# Patient Record
Sex: Female | Born: 1965 | Race: White | Hispanic: Yes | Marital: Married | Smoking: Former smoker
Health system: Southern US, Community
[De-identification: ages and names within clinical notes are randomized; demographics above are authoritative.]

## PROBLEM LIST (undated history)

## (undated) DIAGNOSIS — E739 Lactose intolerance, unspecified: Secondary | ICD-10-CM

## (undated) DIAGNOSIS — K9041 Non-celiac gluten sensitivity: Secondary | ICD-10-CM

## (undated) DIAGNOSIS — Z91018 Allergy to other foods: Secondary | ICD-10-CM

## (undated) DIAGNOSIS — G43909 Migraine, unspecified, not intractable, without status migrainosus: Secondary | ICD-10-CM

## (undated) DIAGNOSIS — T7840XA Allergy, unspecified, initial encounter: Secondary | ICD-10-CM

## (undated) DIAGNOSIS — R42 Dizziness and giddiness: Secondary | ICD-10-CM

## (undated) HISTORY — DX: Non-celiac gluten sensitivity: K90.41

## (undated) HISTORY — DX: Lactose intolerance, unspecified: E73.9

## (undated) HISTORY — PX: BREAST REDUCTION SURGERY: SHX8

## (undated) HISTORY — DX: Allergy to other foods: Z91.018

## (undated) HISTORY — DX: Allergy, unspecified, initial encounter: T78.40XA

## (undated) HISTORY — PX: MOUTH SURGERY: SHX715

## (undated) HISTORY — PX: TONSILLECTOMY: SHX5217

## (undated) HISTORY — PX: OTHER SURGICAL HISTORY: SHX169

## (undated) HISTORY — PX: REDUCTION MAMMAPLASTY: SUR839

---

## 2017-01-11 ENCOUNTER — Encounter (HOSPITAL_BASED_OUTPATIENT_CLINIC_OR_DEPARTMENT_OTHER): Payer: Self-pay | Admitting: Emergency Medicine

## 2017-01-11 ENCOUNTER — Emergency Department (HOSPITAL_BASED_OUTPATIENT_CLINIC_OR_DEPARTMENT_OTHER)
Admission: EM | Admit: 2017-01-11 | Discharge: 2017-01-12 | Disposition: A | Discharge status: Home / Self Care | Payer: Self-pay | Attending: Emergency Medicine | Admitting: Emergency Medicine

## 2017-01-11 DIAGNOSIS — G43409 Hemiplegic migraine, not intractable, without status migrainosus: Secondary | ICD-10-CM | POA: Insufficient documentation

## 2017-01-11 DIAGNOSIS — Z87891 Personal history of nicotine dependence: Secondary | ICD-10-CM | POA: Insufficient documentation

## 2017-01-11 HISTORY — DX: Migraine, unspecified, not intractable, without status migrainosus: G43.909

## 2017-01-11 LAB — RAPID STREP SCREEN (MED CTR MEBANE ONLY): STREPTOCOCCUS, GROUP A SCREEN (DIRECT): NEGATIVE

## 2017-01-11 MED ORDER — DIPHENHYDRAMINE HCL 50 MG/ML IJ SOLN
25.0000 mg | Freq: Once | INTRAMUSCULAR | Status: AC
  Administered 2017-01-12: 25 mg via INTRAVENOUS
  Filled 2017-01-11: qty 1

## 2017-01-11 MED ORDER — METOCLOPRAMIDE HCL 5 MG/ML IJ SOLN
10.0000 mg | Freq: Once | INTRAMUSCULAR | Status: AC
  Administered 2017-01-12: 10 mg via INTRAVENOUS
  Filled 2017-01-11: qty 2

## 2017-01-11 MED ORDER — KETOROLAC TROMETHAMINE 15 MG/ML IJ SOLN
15.0000 mg | Freq: Once | INTRAMUSCULAR | Status: AC
  Administered 2017-01-12: 15 mg via INTRAVENOUS
  Filled 2017-01-11: qty 1

## 2017-01-11 MED ORDER — SODIUM CHLORIDE 0.9 % IV BOLUS (SEPSIS)
1000.0000 mL | Freq: Once | INTRAVENOUS | Status: AC
  Administered 2017-01-12: 1000 mL via INTRAVENOUS

## 2017-01-11 NOTE — ED Triage Notes (Signed)
Pt presents with c/o headache, nausea and sore throat

## 2017-01-11 NOTE — ED Provider Notes (Signed)
[facility] Provider Note: Lowella Dell, MD, FACEP  CSN: [id number] MRN: [id number] ARRIVAL: 01/11/17 at 1956 ROOM: MH01/[facility]01   CHIEF COMPLAINT  Headache   HISTORY OF PRESENT ILLNESS  01/11/17 11:31 PM Amber Simpson is a 51 y.o. female with a 3 to four-day history of sore throat, subjective fever and swollen glands of the neck. Her sore throat has significantly improved but she is now complaining of pain on the left side of her head, in her left ear and behind her left eye which began about 2 days ago and has gotten worse. She rates her pain is moderate to severe and describes the pain as like previous migraines. She has associated nausea but no vomiting. She has been taking azithromycin and over the counter analgesics without relief.    Past Medical History:  Diagnosis Date  . Migraine     History reviewed. No pertinent surgical history.  No family history on file.  Social History  Substance Use Topics  . Smoking status: Former Smoker    Types: Cigarettes    Quit date: 12/07/2016  . Smokeless tobacco: Never Used  . Alcohol use No    Prior to Admission medications   Not on File    Allergies Patient has no known allergies.   REVIEW OF SYSTEMS  Negative except as noted here or in the History of Present Illness.   PHYSICAL EXAMINATION  Initial Vital Signs Blood pressure 102/64, pulse 72, temperature 98 F (36.7 C), temperature source Oral, resp. rate 18, height 5\' 4"  (1.626 m), weight 56.4 kg (124 lb 7 oz), last menstrual period 01/05/2016, SpO2 100 %.  Examination General: Well-developed, well-nourished female in no acute distress; appearance consistent with age of record HENT: normocephalic; atraumatic; no pharyngeal erythema, exudate or edema; TMs normal Eyes: pupils equal, round and reactive to light; extraocular muscles intact Neck: supple; no lymphadenopathy Heart: regular rate and rhythm Lungs: clear to auscultation bilaterally Abdomen: soft;  nondistended; nontender; bowel sounds present Extremities: No deformity; full range of motion; pulses normal Neurologic: Awake, alert and oriented; motor function intact in all extremities and symmetric; no facial droop Skin: Warm and dry Psychiatric: Normal mood and affect   RESULTS  Summary of this visit's results, reviewed by myself:   EKG Interpretation  Date/Time:    Ventricular Rate:    PR Interval:    QRS Duration:   QT Interval:    QTC Calculation:   R Axis:     Text Interpretation:        Laboratory Studies: Results for orders placed or performed during the hospital encounter of 01/11/17 (from the past 24 hour(s))  Rapid strep screen     Status: None   Collection Time: 01/11/17  8:07 PM  Result Value Ref Range   Streptococcus, Group A Screen (Direct) NEGATIVE NEGATIVE   Imaging Studies: No results found.  ED COURSE  Nursing notes and initial vitals signs, including pulse oximetry, reviewed.  Vitals:   01/11/17 2006 01/12/17 0000  BP: 102/64 120/73  Pulse: 72 66  Resp: 18 18  Temp: 98 F (36.7 C) 97.7 F (36.5 C)  TempSrc: Oral Oral  SpO2: 100% 100%  Weight: 56.4 kg (124 lb 7 oz)   Height: 5\' 4"  (1.626 m)    12:59 AM Patient feels significantly better after IV fluids and migraine cocktail. She rates her pain as a one out of 10 now.  PROCEDURES    ED DIAGNOSES     ICD-10-CM   1.  Sporadic migraine G43.409        Abiel Antrim, MD 01/12/17 0100

## 2017-01-12 ENCOUNTER — Encounter (HOSPITAL_BASED_OUTPATIENT_CLINIC_OR_DEPARTMENT_OTHER): Payer: Self-pay | Admitting: Emergency Medicine

## 2017-01-12 NOTE — ED Notes (Signed)
Patient c/o headache that started this morning. Treated with ibuprofen x2 at home without relief. Patient denies emesis, did have nausea throughout the day. A&Ox4.

## 2017-01-14 LAB — CULTURE, GROUP A STREP (THRC)

## 2019-01-19 ENCOUNTER — Other Ambulatory Visit: Payer: Self-pay | Admitting: *Deleted

## 2019-01-19 ENCOUNTER — Other Ambulatory Visit: Payer: Self-pay

## 2019-01-19 DIAGNOSIS — Z20822 Contact with and (suspected) exposure to covid-19: Secondary | ICD-10-CM

## 2019-01-21 LAB — NOVEL CORONAVIRUS, NAA: SARS-CoV-2, NAA: NOT DETECTED

## 2019-02-17 ENCOUNTER — Other Ambulatory Visit: Payer: Self-pay | Admitting: *Deleted

## 2019-02-17 DIAGNOSIS — Z20822 Contact with and (suspected) exposure to covid-19: Secondary | ICD-10-CM

## 2019-02-19 LAB — NOVEL CORONAVIRUS, NAA: SARS-CoV-2, NAA: NOT DETECTED

## 2019-02-28 ENCOUNTER — Telehealth: Payer: Self-pay | Admitting: General Practice

## 2019-02-28 NOTE — Telephone Encounter (Signed)
Negative COVID results given. Patient results "NOT Detected." Caller expressed understanding. ° °

## 2019-05-22 ENCOUNTER — Emergency Department (HOSPITAL_COMMUNITY)
Admission: EM | Admit: 2019-05-22 | Discharge: 2019-05-22 | Disposition: A | Discharge status: Home / Self Care | Payer: Self-pay | Attending: Emergency Medicine | Admitting: Emergency Medicine

## 2019-05-22 ENCOUNTER — Encounter (HOSPITAL_COMMUNITY): Payer: Self-pay | Admitting: Emergency Medicine

## 2019-05-22 ENCOUNTER — Other Ambulatory Visit: Payer: Self-pay

## 2019-05-22 DIAGNOSIS — R1110 Vomiting, unspecified: Secondary | ICD-10-CM | POA: Insufficient documentation

## 2019-05-22 DIAGNOSIS — H81399 Other peripheral vertigo, unspecified ear: Secondary | ICD-10-CM

## 2019-05-22 DIAGNOSIS — R42 Dizziness and giddiness: Secondary | ICD-10-CM | POA: Insufficient documentation

## 2019-05-22 DIAGNOSIS — R519 Headache, unspecified: Secondary | ICD-10-CM | POA: Insufficient documentation

## 2019-05-22 HISTORY — DX: Dizziness and giddiness: R42

## 2019-05-22 HISTORY — DX: Migraine, unspecified, not intractable, without status migrainosus: G43.909

## 2019-05-22 MED ORDER — MECLIZINE HCL 25 MG PO TABS: 25.0000 mg | ORAL_TABLET | Freq: Three times a day (TID) | ORAL | 0 refills | Status: DC | PRN

## 2019-05-22 MED ORDER — DIPHENHYDRAMINE HCL 50 MG/ML IJ SOLN
25.0000 mg | Freq: Once | INTRAMUSCULAR | Status: AC
  Administered 2019-05-22: 25 mg via INTRAVENOUS
  Filled 2019-05-22: qty 1

## 2019-05-22 MED ORDER — PROCHLORPERAZINE EDISYLATE 10 MG/2ML IJ SOLN
10.0000 mg | Freq: Once | INTRAMUSCULAR | Status: AC
  Administered 2019-05-22: 10 mg via INTRAVENOUS
  Filled 2019-05-22: qty 2

## 2019-05-22 MED ORDER — SODIUM CHLORIDE 0.9 % IV BOLUS
1000.0000 mL | Freq: Once | INTRAVENOUS | Status: AC
  Administered 2019-05-22: 1000 mL via INTRAVENOUS

## 2019-05-22 MED ORDER — MECLIZINE HCL 12.5 MG PO TABS
25.0000 mg | ORAL_TABLET | Freq: Once | ORAL | Status: AC
  Administered 2019-05-22: 25 mg via ORAL
  Filled 2019-05-22: qty 2

## 2019-05-22 MED ORDER — ONDANSETRON HCL 4 MG/2ML IJ SOLN
4.0000 mg | Freq: Once | INTRAMUSCULAR | Status: AC
  Administered 2019-05-22: 4 mg via INTRAVENOUS
  Filled 2019-05-22: qty 2

## 2019-05-22 NOTE — ED Triage Notes (Signed)
Pt states that she has had a migraine for 2 days and it is causing her to vomit

## 2019-05-22 NOTE — Discharge Instructions (Signed)
Use meclizine as needed for persistent symptoms.  We advise follow-up with a primary care doctor.  You may return for any new or concerning symptoms.

## 2019-05-22 NOTE — ED Provider Notes (Signed)
[facility] EMERGENCY DEPARTMENT Provider Note   CSN: [id number] Arrival date & time: 05/22/19  1253     History Chief Complaint  Patient presents with  . Headache  . Emesis    Amber Simpson is a 54 y.o. female.   54 year old female with a history of vertigo and migraines presents to the emergency department for dizziness.  She characterizes her dizziness as feeling as though she is on a boat.  Her symptoms were preceded by a headache similar to prior migraines, located in her left frontal region.  She also describes a sensation of pressure underneath her left eye.  Her dizziness has remained constant despite improvement to her pain.  It is aggravated by position change and has resulted in nausea as well as multiple episodes of vomiting and dry heaves.  She complains of some light sensitivity.  Reports fleeting episode of paresthesias in her right hand last night, but denies any other extremity numbness, tingling, weakness.  She has not had any fevers or recent head injury, tinnitus, blurry vision, loss of vision, new/worsening congestion.  The history is provided by the patient. No language interpreter was used.  Headache Associated symptoms: vomiting   Emesis Associated symptoms: headaches        Past Medical History:  Diagnosis Date  . Migraines   . Vertigo     There are no problems to display for this patient.   ** The histories are not reviewed yet. Please review them in the "History" navigator section and refresh this Williams.   OB History   No obstetric history on file.     No family history on file.  Social History   Tobacco Use  . Smoking status: Not on file  Substance Use Topics  . Alcohol use: Not on file  . Drug use: Not on file    Home Medications Prior to Admission medications   Medication Sig Start Date End Date Taking? Authorizing Provider  meclizine (ANTIVERT) 25 MG tablet Take 1 tablet (25 mg total) by mouth 3 (three) times  daily as needed for dizziness. 05/22/19   Antonietta Breach, PA-C    Allergies    Patient has no known allergies.  Review of Systems   Review of Systems  Gastrointestinal: Positive for vomiting.  Neurological: Positive for headaches.  Ten systems reviewed and are negative for acute change, except as noted in the HPI.    Physical Exam Updated Vital Signs BP 122/84 (BP Location: Right Arm)   Pulse 95   Temp (!) 97.3 F (36.3 C) (Oral)   Resp 12   Ht 5\' 6"  (1.676 m)   Wt 63.5 kg   SpO2 100%   BMI 22.60 kg/m   Physical Exam Vitals and nursing note reviewed.  Constitutional:      General: She is not in acute distress.    Appearance: She is well-developed. She is not diaphoretic.     Comments: Appears unwell, dry heaving. Nontoxic.  HENT:     Head: Normocephalic and atraumatic.     Right Ear: Tympanic membrane, ear canal and external ear normal.     Left Ear: Tympanic membrane, ear canal and external ear normal.     Mouth/Throat:     Mouth: Mucous membranes are moist.     Comments: Symmetric rise of the uvula with phonation. Eyes:     General: No scleral icterus.    Extraocular Movements: Extraocular movements intact.     Conjunctiva/sclera: Conjunctivae normal.     Pupils:  Pupils are equal, round, and reactive to light.     Comments: No appreciable nystagmus  Neck:     Comments: No meningismus Cardiovascular:     Rate and Rhythm: Normal rate and regular rhythm.     Pulses: Normal pulses.  Pulmonary:     Effort: Pulmonary effort is normal. No respiratory distress.     Comments: Respirations even and unlabored Musculoskeletal:        General: Normal range of motion.     Cervical back: Normal range of motion.  Skin:    General: Skin is warm and dry.     Coloration: Skin is not pale.     Findings: No erythema or rash.  Neurological:     General: No focal deficit present.     Mental Status: She is alert and oriented to person, place, and time.     Coordination:  Coordination normal.     Comments: GCS 15. Speech is goal oriented. No cranial nerve deficits appreciated; symmetric eyebrow raise, no facial drooping, tongue midline. Patient has equal grip strength bilaterally with 5/5 strength against resistance in all major muscle groups bilaterally. Sensation to light touch intact. Patient moves extremities without ataxia. Patient ambulatory with steady gait.  Psychiatric:        Behavior: Behavior normal.     ED Results / Procedures / Treatments   Labs (all labs ordered are listed, but only abnormal results are displayed) Labs Reviewed - No data to display  EKG None  Radiology No results found.  Procedures Procedures (including critical care time)  Medications Ordered in ED Medications  prochlorperazine (COMPAZINE) injection 10 mg (10 mg Intravenous Given 05/22/19 1354)  diphenhydrAMINE (BENADRYL) injection 25 mg (25 mg Intravenous Given 05/22/19 1354)  sodium chloride 0.9 % bolus 1,000 mL (0 mLs Intravenous Stopped 05/22/19 1512)  meclizine (ANTIVERT) tablet 25 mg (25 mg Oral Given 05/22/19 1510)  ondansetron (ZOFRAN) injection 4 mg (4 mg Intravenous Given 05/22/19 1509)    ED Course  I have reviewed the triage vital signs and the nursing notes.  Pertinent labs & imaging results that were available during my care of the patient were reviewed by me and considered in my medical decision making (see chart for details).   2:52 PM Symptoms improved and patient can now sit up right and change position more freely. Still c/o some mild dizziness. Will give Zofran and Meclizine and reassess.  3:54 PM Patient states that she is completely asymptomatic.  Her dizziness has resolved.  She expresses comfort with discharge.     MDM Rules/Calculators/A&P                      Patient presents to the emergency department for evaluation of dizziness. Symptoms preceded by headache. Associated with N/V, photophobia.  Patient with no history of recent head  injury or trauma.  No fever, nuchal rigidity, meningismus to suggest meningitis.  Neurologic exam today is nonfocal.    On reassessment, the patient has had significant improvement in headache symptoms following a migraine cocktail.  I do not believe further emergent workup is indicated at this time.  Suspect migraine with component of BPPV.  Encouraged follow up with patient's PCP.  Return precautions discussed and provided.  Patient discharged in stable condition with no unaddressed concerns.  Vitals:   05/22/19 1307 05/22/19 1308 05/22/19 1512  BP: 117/68  122/84  Pulse: 85  95  Resp: 14  12  Temp: (!) 97.3 F (36.3 C)  TempSrc: Oral    SpO2: 96%  100%  Weight:  63.5 kg   Height:  5\' 6"  (1.676 m)     Final Clinical Impression(s) / ED Diagnoses Final diagnoses:  Peripheral vertigo, unspecified laterality    Rx / DC Orders ED Discharge Orders         Ordered    meclizine (ANTIVERT) 25 MG tablet  3 times daily PRN     05/22/19 1552           07/20/19, PA-C 05/22/19 1557    07/20/19, MD 06/03/19 1512

## 2020-01-25 ENCOUNTER — Ambulatory Visit: Payer: Self-pay | Admitting: Physician Assistant

## 2020-01-31 ENCOUNTER — Ambulatory Visit: Payer: Self-pay | Admitting: Physician Assistant

## 2020-01-31 ENCOUNTER — Encounter: Payer: Self-pay | Admitting: Physician Assistant

## 2020-01-31 ENCOUNTER — Other Ambulatory Visit: Payer: Self-pay

## 2020-01-31 VITALS — BP 86/64 | HR 76 | Temp 98.1°F | Ht 64.25 in | Wt 139.5 lb

## 2020-01-31 DIAGNOSIS — Z7689 Persons encountering health services in other specified circumstances: Secondary | ICD-10-CM

## 2020-01-31 DIAGNOSIS — R42 Dizziness and giddiness: Secondary | ICD-10-CM

## 2020-01-31 DIAGNOSIS — Z13220 Encounter for screening for lipoid disorders: Secondary | ICD-10-CM

## 2020-01-31 DIAGNOSIS — R079 Chest pain, unspecified: Secondary | ICD-10-CM

## 2020-01-31 DIAGNOSIS — R1013 Epigastric pain: Secondary | ICD-10-CM

## 2020-01-31 DIAGNOSIS — G43809 Other migraine, not intractable, without status migrainosus: Secondary | ICD-10-CM

## 2020-01-31 DIAGNOSIS — Z1239 Encounter for other screening for malignant neoplasm of breast: Secondary | ICD-10-CM

## 2020-01-31 DIAGNOSIS — Z8249 Family history of ischemic heart disease and other diseases of the circulatory system: Secondary | ICD-10-CM

## 2020-01-31 DIAGNOSIS — R9431 Abnormal electrocardiogram [ECG] [EKG]: Secondary | ICD-10-CM

## 2020-01-31 NOTE — Progress Notes (Signed)
BP (!) 86/64   Pulse 76   Temp 98.1 F (36.7 C)   Ht 5' 4.25" (1.632 m)   Wt 139 lb 8 oz (63.3 kg)   SpO2 97%   BMI 23.76 kg/m    Subjective:    Patient ID: Amber Simpson, female    DOB: Sep 16, 1965, 54 y.o.   MRN: [id number]  HPI: Amber Simpson is a 54 y.o. female presenting on 01/31/2020 for No chief complaint on file.   HPI    Pt had a negative covid 19 screening questionnaire.   Pt is a 54yoF who presents to establish care.  She does not work.   She moved here in May last year.  From [place].  She is still having dizziness problems that she was seen in ER for this back in January.  Also she has tingling Left 3rd 4th, 5th fingers.    She has Hx migraine Since she was about 54 yo.  She gets them maybe 3/week.   She used to take APAP but stopped due to concerns  about her liver.  She does use an OTC medication that relieves her pain unless she gets vomiting.  She says she only has that maybe once every other month.  She has medicine for vomiting.    She has teeth problems   She does better when she wears a mouth guard at night.    Her last mammogram was in [place] about 13 year year ago.  She had reduction surgery and beningn tumor removed.    She exercises and is active.    She has feeling like a roller coaster in her stomach or chest.  She says it happens when she is emotional and other times when she is not.  She points to epigastric are and says it is her heart that hurts.    She says sometimes she feels sob- sometimes with exertion, sometimes at rest.  Pt says she was being evaluated by a cardiologist in the past.  It sounds like she may have had a holter but it isn't certain.  She says the cardiologist told her she needed another test but she moved away before it could be done.  She says that her EKG in the past had abnormalities.  She is no longer have menses for a long time, since  2015.  She thinks that was also when she had her last  PAP.  Pt speaks reasonably good [place] but our translator was utilized due to nuances and the nature of her complaints, to clarify.         Relevant past medical, surgical, family and social history reviewed and updated as indicated. Interim medical history since our last visit reviewed. Allergies and medications reviewed and updated.  No current outpatient medications on file.     Review of Systems  Per HPI unless specifically indicated above     Objective:    BP (!) 86/64   Pulse 76   Temp 98.1 F (36.7 C)   Ht 5' 4.25" (1.632 m)   Wt 139 lb 8 oz (63.3 kg)   SpO2 97%   BMI 23.76 kg/m   Wt Readings from Last 3 Encounters:  01/31/20 139 lb 8 oz (63.3 kg)  05/22/19 140 lb (63.5 kg)    Orthostatic VS for the past 24 hrs:  BP- Lying Pulse- Lying BP- Sitting Pulse- Sitting BP- Standing at 0 minutes Pulse- Standing at 0 minutes  01/31/20 1022 106/71 68 101/69 73 96/65 79  Physical Exam Vitals reviewed.  Constitutional:      General: She is not in acute distress.    Appearance: She is well-developed and normal weight. She is not ill-appearing.  HENT:     Head: Normocephalic and atraumatic.     Mouth/Throat:     Pharynx: No oropharyngeal exudate.     Comments: Dentures/plate on upper Eyes:     Conjunctiva/sclera: Conjunctivae normal.     Pupils: Pupils are equal, round, and reactive to light.  Neck:     Thyroid: No thyromegaly.  Cardiovascular:     Rate and Rhythm: Normal rate and regular rhythm.  Pulmonary:     Effort: Pulmonary effort is normal.     Breath sounds: Normal breath sounds.  Abdominal:     General: Bowel sounds are normal.     Palpations: Abdomen is soft. There is no mass.     Tenderness: There is no abdominal tenderness.  Musculoskeletal:     Cervical back: Neck supple.     Right lower leg: No edema.     Left lower leg: No edema.  Lymphadenopathy:     Cervical: No cervical adenopathy.  Skin:    General: Skin is warm and dry.   Neurological:     Mental Status: She is alert and oriented to person, place, and time.     Cranial Nerves: No facial asymmetry.     Motor: No weakness or tremor.     Gait: Gait is intact. Gait normal.     Deep Tendon Reflexes:     Reflex Scores:      Patellar reflexes are 2+ on the right side and 2+ on the left side. Psychiatric:        Attention and Perception: Attention normal.        Mood and Affect: Mood normal.        Speech: Speech normal.        Behavior: Behavior normal. Behavior is cooperative.     EKG- NSR at 64bpm.  Some T wave changes in V1, V2.  No previous EKG for comparison (pt reports abnormal EKG in the past)        Assessment & Plan:    Encounter Diagnoses  Name Primary?  . Encounter to establish care Yes  . Chest pain, unspecified type   . Epigastric abdominal pain   . Family history of heart disease   . Dizziness   . Other migraine without status migrainosus, not intractable   . Abnormal EKG   . Encounter for screening for malignant neoplasm of breast, unspecified screening modality   . Screening cholesterol level      -will get baseline Labs -refer for screening Mammogram -refer to Cardiology.  Pt symptoms don't especially sound cardiac, but in light of abnormal EKG and her mother died at age 75 of MI, referral is prudent.  No beta-blockers or NTG due to low bp . -pt is given cafa/application for [employer] financial assistance -Continue otc migraine meds as needed.  She has some anti-emetic pills.   -Will update PAP in near future -pt to follow up in 1 month.  She is to contact office sooner prn

## 2020-02-15 ENCOUNTER — Other Ambulatory Visit: Payer: Self-pay | Admitting: Student

## 2020-02-15 ENCOUNTER — Encounter: Payer: Self-pay | Admitting: Physician Assistant

## 2020-02-15 DIAGNOSIS — Z1239 Encounter for other screening for malignant neoplasm of breast: Secondary | ICD-10-CM

## 2020-02-28 ENCOUNTER — Ambulatory Visit: Payer: Self-pay | Admitting: Physician Assistant

## 2020-03-06 ENCOUNTER — Ambulatory Visit: Payer: Self-pay | Admitting: Physician Assistant

## 2020-03-27 ENCOUNTER — Ambulatory Visit: Payer: 59 | Admitting: Cardiovascular Disease

## 2020-03-27 ENCOUNTER — Other Ambulatory Visit: Payer: Self-pay

## 2020-03-27 VITALS — BP 84/62 | HR 70 | Ht 64.25 in | Wt 139.8 lb

## 2020-03-27 DIAGNOSIS — I493 Ventricular premature depolarization: Secondary | ICD-10-CM | POA: Diagnosis not present

## 2020-03-27 DIAGNOSIS — K5900 Constipation, unspecified: Secondary | ICD-10-CM | POA: Insufficient documentation

## 2020-03-27 DIAGNOSIS — K5909 Other constipation: Secondary | ICD-10-CM

## 2020-03-27 HISTORY — DX: Ventricular premature depolarization: I49.3

## 2020-03-27 LAB — BASIC METABOLIC PANEL
BUN/Creatinine Ratio: 17 (ref 9–23)
BUN: 11 mg/dL (ref 6–24)
CO2: 26 mmol/L (ref 20–29)
Calcium: 9.3 mg/dL (ref 8.7–10.2)
Chloride: 104 mmol/L (ref 96–106)
Creatinine, Ser: 0.66 mg/dL (ref 0.57–1.00)
GFR calc Af Amer: 117 mL/min/{1.73_m2} (ref >59)
GFR calc non Af Amer: 101 mL/min/{1.73_m2} (ref >59)
Glucose: 86 mg/dL (ref 65–99)
Potassium: 4 mmol/L (ref 3.5–5.2)
Sodium: 141 mmol/L (ref 134–144)

## 2020-03-27 LAB — HEPATIC FUNCTION PANEL
ALT: 8 IU/L (ref 0–32)
AST: 19 IU/L (ref 0–40)
Albumin: 4.5 g/dL (ref 3.8–4.9)
Alkaline Phosphatase: 71 IU/L (ref 44–121)
Bilirubin Total: <0.2 mg/dL (ref 0.0–1.2)
Bilirubin, Direct: <0.1 mg/dL (ref 0.00–0.40)
Total Protein: 6.9 g/dL (ref 6.0–8.5)

## 2020-03-27 LAB — CBC
Hematocrit: 39 % (ref 34.0–46.6)
Hemoglobin: 12.9 g/dL (ref 11.1–15.9)
MCH: 29.1 pg (ref 26.6–33.0)
MCHC: 33.1 g/dL (ref 31.5–35.7)
MCV: 88 fL (ref 79–97)
Platelets: 291 10*3/uL (ref 150–450)
RBC: 4.43 x10E6/uL (ref 3.77–5.28)
RDW: 12.8 % (ref 11.7–15.4)
WBC: 5.7 10*3/uL (ref 3.4–10.8)

## 2020-03-27 LAB — LIPID PANEL
Chol/HDL Ratio: 2.5 ratio (ref 0.0–4.4)
Cholesterol, Total: 143 mg/dL (ref 100–199)
HDL: 57 mg/dL (ref >39)
LDL Chol Calc (NIH): 75 mg/dL (ref 0–99)
Triglycerides: 53 mg/dL (ref 0–149)
VLDL Cholesterol Cal: 11 mg/dL (ref 5–40)

## 2020-03-27 LAB — TSH: TSH: 1.39 u[IU]/mL (ref 0.450–4.500)

## 2020-03-27 NOTE — Patient Instructions (Addendum)
Try sugarfree Metamucil - start twice a day for a week and once a day   Try V8 once a day    Medication Instructions:  Your physician recommends that you continue on your current medications as directed. Please refer to the Current Medication list given to you today.  *If you need a refill on your cardiac medications before your next appointment, please call your pharmacy*   Lab Work: Bmet, cbc, lipid, liver, tsh  If you have labs (blood work) drawn today and your tests are completely normal, you will receive your results only by: Marland Kitchen MyChart Message (if you have MyChart) OR . A paper copy in the mail If you have any lab test that is abnormal or we need to change your treatment, we will call you to review the results.   Testing/Procedures: none   Follow-Up: At [facility], you and your health needs are our priority.  As part of our continuing mission to provide you with exceptional heart care, we have created designated Provider Care Teams.  These Care Teams include your primary Cardiologist (physician) and Advanced Practice Providers (APPs -  Physician Assistants and Nurse Practitioners) who all work together to provide you with the care you need, when you need it.  We recommend signing up for the patient portal called "MyChart".  Sign up information is provided on this After Visit Summary.  MyChart is used to connect with patients for Virtual Visits (Telemedicine).  Patients are able to view lab/test results, encounter notes, upcoming appointments, etc.  Non-urgent messages can be sent to your provider as well.   To learn more about what you can do with MyChart, go to [email or web address].    Your next appointment:   3 month(s)  The format for your next appointment:   In Person  Provider:   Kristeen Miss, MD   Other Instructions

## 2020-03-27 NOTE — Progress Notes (Signed)
Cardiology Office Note:    Date:  03/27/2020   ID:  Amber Simpson, DOB November 26, 1965, MRN [id number]  PCP:  Patient, No Pcp Per  [facility] Cardiologist:  Amber Simpson  [facility] Electrophysiologist:  None   Referring MD: Jacquelin Hawking, PA-C   Chief Complaint  Patient presents with  . Abnormal ECG    Nov. 16, 2021:   Amber Simpson is a 54 y.o. female with a hx of an abnormal ECG  We were asked to see her by Dr. Katherene Simpson for further evaluation ofher abnormal ECG  Has had some tingling in her left hand Has had some palpitations . Has some fatigue , has DOE ,   Exercises regularly ,   Has moved to the [place] 2 years ago   Mother died at age 67 ,  Due to MI / cardiac arrest Brother died 2 months ago at age 82.   Still able to get out and walk . Walks daily  But gets fatigued quicker .   Seems to be worse after her covid Vaccine in July   Has significant constipation, Some blood tinged on sides of stool.   She has worn a monitor   Past Medical History:  Diagnosis Date  . Allergy   . Migraines   . Vertigo     Past Surgical History:  Procedure Laterality Date  . BREAST REDUCTION SURGERY Bilateral   . 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 TONSILLECTOMY      Current Medications: Current Meds  Medication Sig  . Ascorbic Acid (VITAMIN C PO) Take by mouth.  Marland Kitchen glucosamine-chondroitin 500-400 MG tablet Take 1 tablet by mouth in the morning and at bedtime.  . Multiple Vitamins-Minerals (MULTIVITAMIN WITH MINERALS) tablet Take 1 tablet by mouth daily.  Marland Kitchen VITAMIN D PO Take by mouth.     Allergies:   Other   Social History   Socioeconomic History  . Marital status: Married    Spouse name: Not on file  . Number of children: Not on file  . Years of education: Not on file  . Highest education level: Not on file  Occupational History  . Not on file  Tobacco Use  . Smoking status: Former Smoker     Packs/day: 1.00    Years: 30.00    Pack years: 30.00    Types: Cigarettes    Quit date: 05/12/2017    Years since quitting: 2.8  . Smokeless tobacco: Never Used  Vaping Use  . Vaping Use: Never used  Substance and Sexual Activity  . Alcohol use: Yes    Comment: occasional wine or margarita  . Drug use: Not Currently  . Sexual activity: Not on file  Other Topics Concern  . Not on file  Social History Narrative  . Not on file   Social Determinants of Health   Financial Resource Strain:   . Difficulty of Paying Living Expenses: Not on file  Food Insecurity:   . Worried About Programme researcher, broadcasting/film/video in the Last Year: Not on file  . Ran Out of Food in the Last Year: Not on file  Transportation Needs:   . Lack of Transportation (Medical): Not on file  . Lack of Transportation (Non-Medical): Not on file  Physical Activity:   . Days of Exercise per Week: Not on file  . Minutes of Exercise per Session: Not on file  Stress:   . Feeling of Stress : Not  on file  Social Connections:   . Frequency of Communication with Friends and Family: Not on file  . Frequency of Social Gatherings with Friends and Family: Not on file  . Attends Religious Services: Not on file  . Active Member of Clubs or Organizations: Not on file  . Attends Banker Meetings: Not on file  . Marital Status: Not on file     Family History: The patient's family history includes Cancer in her father; Diabetes in her brother and father; Heart attack in her mother; Kidney disease in her brother.  ROS:   Please see the history of present illness.     All other systems reviewed and are negative.  EKGs/Labs/Other Studies Reviewed:    The following studies were reviewed today:   EKG:  Sept 21, 2021: Normal sinus rhythm at 65.  She has poor R wave progression which I suspect is due to lead placement abnormality.  Her R wave starts in V3 but she has a large R wave in V3 suggestive of lead placement  abnormality.  Recent Labs: No results found for requested labs within last 8760 hours.  Recent Lipid Panel No results found for: CHOL, TRIG, HDL, CHOLHDL, VLDL, LDLCALC, LDLDIRECT   Risk Assessment/Calculations:     Physical Exam:    VS:  BP (!) 84/62   Pulse 70   Ht 5' 4.25" (1.632 m)   Wt 139 lb 12.8 oz (63.4 kg)   SpO2 97%   BMI 23.81 kg/m     Wt Readings from Last 3 Encounters:  03/27/20 139 lb 12.8 oz (63.4 kg)  01/31/20 139 lb 8 oz (63.3 kg)  05/22/19 140 lb (63.5 kg)     GEN:  Well nourished, well developed in no acute distress HEENT: Normal NECK: No JVD; No carotid bruits LYMPHATICS: No lymphadenopathy CARDIAC: RRR, no murmurs, rubs, gallops RESPIRATORY:  Clear to auscultation without rales, wheezing or rhonchi  ABDOMEN: Soft, non-tender, non-distended MUSCULOSKELETAL:  No edema; No deformity  SKIN: Warm and dry NEUROLOGIC:  Alert and oriented x 3 PSYCHIATRIC:  Normal affect   ASSESSMENT:    1. PVC (premature ventricular contraction)    PLAN:      1. Abnormal EKG: She has Q waves in leads V1 and V2 but I large T wave in V3.  I suggest that this is probably due to lead placement.  She is not having any angina symptoms  2.  Hypotension: She does not eat and drink regularly.  She has a significant issue with constipation so she does not eat and drink regularly.  I encouraged her to eat more protein and in fact eat protein with every meal.  She needs to drink a V8 juice daily.  3.  Constipation: She has a significant issue with constipation.  We will have her start with sugar-free Metamucil twice a day or least once a day.  She needs to drink more water .   4.  PVCs:   She described having palpitations which clinically sound like premature ventricular contractions.  She has been under considerable stress.  I think if she were to start eating better making sure that she is getting enough potassium I think her PVCs will resolve.  I have reassured her of the  benign nature of these PVCs.  She has never had any syncope, presyncope chest pain, or shortness of breath associated with these arrhythmias.  Shared Decision Making/Informed Consent        Medication Adjustments/Labs and Tests  Ordered: Current medicines are reviewed at length with the patient today.  Concerns regarding medicines are outlined above.  Orders Placed This Encounter  Procedures  . Basic Metabolic Panel (BMET)  . Lipid Profile  . Hepatic function panel  . CBC  . TSH   No orders of the defined types were placed in this encounter.    Patient Instructions  Try sugarfree Metamucil - start twice a day for a week and once a day   Try V8 once a day    Medication Instructions:  Your physician recommends that you continue on your current medications as directed. Please refer to the Current Medication list given to you today.  *If you need a refill on your cardiac medications before your next appointment, please call your pharmacy*   Lab Work: Bmet, cbc, lipid, liver, tsh  If you have labs (blood work) drawn today and your tests are completely normal, you will receive your results only by: Marland Kitchen MyChart Message (if you have MyChart) OR . A paper copy in the mail If you have any lab test that is abnormal or we need to change your treatment, we will call you to review the results.   Testing/Procedures: none   Follow-Up: At [facility], you and your health needs are our priority.  As part of our continuing mission to provide you with exceptional heart care, we have created designated Provider Care Teams.  These Care Teams include your primary Cardiologist (physician) and Advanced Practice Providers (APPs -  Physician Assistants and Nurse Practitioners) who all work together to provide you with the care you need, when you need it.  We recommend signing up for the patient portal called "MyChart".  Sign up information is provided on this After Visit Summary.  MyChart is used  to connect with patients for Virtual Visits (Telemedicine).  Patients are able to view lab/test results, encounter notes, upcoming appointments, etc.  Non-urgent messages can be sent to your provider as well.   To learn more about what you can do with MyChart, go to [email or web address].    Your next appointment:   3 month(s)  The format for your next appointment:   In Person  Provider:   Kristeen Miss, MD   Other Instructions       Signed, Kristeen Miss, MD  03/27/2020 1:25 PM    [facility]

## 2020-03-28 ENCOUNTER — Encounter (HOSPITAL_COMMUNITY): Payer: Self-pay

## 2020-03-28 ENCOUNTER — Ambulatory Visit (HOSPITAL_COMMUNITY): Payer: Self-pay

## 2020-04-02 ENCOUNTER — Telehealth: Payer: Self-pay

## 2020-04-02 ENCOUNTER — Telehealth: Payer: Self-pay | Admitting: Cardiovascular Disease

## 2020-04-02 NOTE — Telephone Encounter (Signed)
-----   Message from Philip J Nahser, MD sent at 03/27/2020  5:40 PM EST ----- Cbc is stable Hb is on the low side - this is farily common for 54 yo females.  

## 2020-04-02 NOTE — Telephone Encounter (Signed)
Left a message to call the office back regarding lab results.  

## 2020-04-02 NOTE — Telephone Encounter (Signed)
-----   Message from Vesta Mixer, MD sent at 03/27/2020  5:40 PM EST ----- Cbc is stable Hb is on the low side - this is farily common for 54 yo females.

## 2020-04-02 NOTE — Telephone Encounter (Signed)
The patient has been notified of the result and verbalized understanding.  All questions (if any) were answered. Leanord Hawking, RN 04/02/2020 2:51 PM

## 2020-04-02 NOTE — Telephone Encounter (Signed)
Foloow Up:       Pt is calling back from 03-28-20, concerning her results.

## 2020-05-24 ENCOUNTER — Other Ambulatory Visit: Payer: 59

## 2020-05-24 ENCOUNTER — Other Ambulatory Visit: Payer: Self-pay

## 2020-05-24 DIAGNOSIS — Z20822 Contact with and (suspected) exposure to covid-19: Secondary | ICD-10-CM

## 2020-05-29 ENCOUNTER — Ambulatory Visit: Payer: 59 | Admitting: Family Medicine

## 2020-05-29 DIAGNOSIS — D219 Benign neoplasm of connective and other soft tissue, unspecified: Secondary | ICD-10-CM | POA: Insufficient documentation

## 2020-05-29 LAB — NOVEL CORONAVIRUS, NAA: SARS-CoV-2, NAA: NOT DETECTED

## 2020-05-30 ENCOUNTER — Encounter: Payer: Self-pay | Admitting: Nurse Practitioner

## 2020-05-30 ENCOUNTER — Ambulatory Visit: Payer: 59 | Admitting: Nurse Practitioner

## 2020-05-30 ENCOUNTER — Other Ambulatory Visit: Payer: Self-pay

## 2020-05-30 DIAGNOSIS — G43909 Migraine, unspecified, not intractable, without status migrainosus: Secondary | ICD-10-CM | POA: Insufficient documentation

## 2020-05-30 DIAGNOSIS — Z7689 Persons encountering health services in other specified circumstances: Secondary | ICD-10-CM | POA: Diagnosis not present

## 2020-05-30 DIAGNOSIS — G8929 Other chronic pain: Secondary | ICD-10-CM | POA: Diagnosis not present

## 2020-05-30 DIAGNOSIS — G43009 Migraine without aura, not intractable, without status migrainosus: Secondary | ICD-10-CM | POA: Diagnosis not present

## 2020-05-30 DIAGNOSIS — M25572 Pain in left ankle and joints of left foot: Secondary | ICD-10-CM | POA: Insufficient documentation

## 2020-05-30 HISTORY — DX: Persons encountering health services in other specified circumstances: Z76.89

## 2020-05-30 MED ORDER — RIZATRIPTAN BENZOATE 10 MG PO TBDP: 10.0000 mg | ORAL_TABLET | ORAL | 0 refills | Status: DC | PRN

## 2020-05-30 NOTE — Assessment & Plan Note (Signed)
-  she states these started at age 55 or 77 -has nausea with vomiting, photo/sound sensitivity, and left-sided pain to her head -no issues today -Rx. maxalt

## 2020-05-30 NOTE — Patient Instructions (Addendum)
It was great meeting you today.  We will meet back up in 1 month for a lab follow-up.  Please bring in any medical records of your immunizations and screenings (like mammogram, PAP smear, hepatitis, HIV, and tuberculosis).  I referred you to the orthopedic specialist for your left ankle pain.  Additionally, I called in Maxalt, or rizatriptan, for your migraines.  Please keep a calendar and mark the days that you have migraine.  The number of migraine days that you have may determine the type of medication that we try.

## 2020-05-30 NOTE — Assessment & Plan Note (Signed)
-  she states she recently had immunizations when immigrating to the [place] -please bring records to next appt

## 2020-05-30 NOTE — Assessment & Plan Note (Signed)
-  she states this has been ongoing for about a year -some tenderness to ankle joint with palpation, lateral malleolus may be slightly larger on left -refer to ortho

## 2020-05-30 NOTE — Progress Notes (Signed)
New Patient Office Visit  Subjective:  Patient ID: Amber Simpson, female    DOB: March 02, 1966  Age: 55 y.o. MRN: [id number]  CC:  Chief Complaint  Patient presents with  . New Patient (Initial Visit)    Migraines - L side     HPI [name] presents for new patient visit. No recent PCP. Last labs Last physical was 3 months ago, Nov 2021. She had some recent immunizations, but cannot recall all of them.  She has migraines and those generally affect the left side of her head.  Her headaches started after her second child was born at around age 26-36.  When she has a headache, she feels like she has crawling on her left 3-5th fingers. When she has a headache, she has light and sound sensitivity and feels like everything is moving.  She states her eye feel dry  Past Medical History:  Diagnosis Date  . Allergy    sutures- she states she her wounds open up around the 3rd day post-op  . Migraines   . Vertigo     Past Surgical History:  Procedure Laterality Date  . BREAST REDUCTION SURGERY Bilateral   . MOUTH SURGERY    . 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 TONSILLECTOMY      Family History  Problem Relation Age of Onset  . Heart attack Mother   . Diabetes Father   . Cancer Father        lung cancer  . Diabetes Brother   . Kidney disease Brother     Social History   Socioeconomic History  . Marital status: Married    Spouse name: Not on file  . Number of children: Not on file  . Years of education: Not on file  . Highest education level: Not on file  Occupational History  . Occupation: [employer]- Part time  Tobacco Use  . Smoking status: Former Smoker    Packs/day: 1.00    Years: 15.00    Pack years: 15.00    Types: Cigarettes    Quit date: 05/12/2017    Years since quitting: 3.0  . Smokeless tobacco: Never Used  Vaping Use  . Vaping Use: Never used  Substance and Sexual Activity  . Alcohol use:  Yes    Comment: occasional wine or margarita; once per week  . Drug use: Not Currently  . Sexual activity: Not on file  Other Topics Concern  . Not on file  Social History Narrative  . Not on file   Social Determinants of Health   Financial Resource Strain: Not on file  Food Insecurity: Not on file  Transportation Needs: Not on file  Physical Activity: Not on file  Stress: Not on file  Social Connections: Not on file  Intimate Partner Violence: Not on file    ROS Review of Systems  Constitutional: Negative.   Respiratory: Negative.   Cardiovascular: Negative.   Musculoskeletal: Positive for arthralgias.       Left ankle pain  Neurological: Positive for headaches.  Psychiatric/Behavioral: Negative.     Objective:   Today's Vitals: BP (!) 95/57   Pulse 74   Temp 99.1 F (37.3 C)   Resp 18   Ht 5' 6" (1.676 m)   Wt 142 lb (64.4 kg)   SpO2 98%   BMI 22.92 kg/m   Physical Exam Constitutional:      Appearance: Normal appearance.  HENT:  Right Ear: Tympanic membrane, ear canal and external ear normal.     Left Ear: Tympanic membrane, ear canal and external ear normal.  Cardiovascular:     Rate and Rhythm: Normal rate and regular rhythm.     Pulses: Normal pulses.     Heart sounds: Normal heart sounds.  Pulmonary:     Effort: Pulmonary effort is normal.     Breath sounds: Normal breath sounds.  Neurological:     General: No focal deficit present.     Mental Status: She is alert and oriented to person, place, and time.     Cranial Nerves: No cranial nerve deficit.     Sensory: No sensory deficit.     Motor: No weakness.     Coordination: Coordination normal.     Gait: Gait normal.     Assessment & Plan:   Problem List Items Addressed This Visit      Cardiovascular and Mediastinum   Migraine    -she states these started at age 44 or 38 -has nausea with vomiting, photo/sound sensitivity, and left-sided pain to her head -no issues today -Rx.  maxalt      Relevant Medications   rizatriptan (MAXALT-MLT) 10 MG disintegrating tablet     Other   Left ankle pain    -she states this has been ongoing for about a year -some tenderness to ankle joint with palpation, lateral malleolus may be slightly larger on left -refer to ortho      Relevant Orders   Ambulatory referral to Orthopedic Surgery   Encounter to establish care    -she states she recently had immunizations when immigrating to the [place] -please bring records to next appt       Relevant Orders   CBC with Differential/Platelet   CMP14+EGFR   Lipid Panel With LDL/HDL Ratio      Outpatient Encounter Medications as of 05/30/2020  Medication Sig  . Ascorbic Acid (VITAMIN C PO) Take by mouth.  Marland Kitchen glucosamine-chondroitin 500-400 MG tablet Take 1 tablet by mouth in the morning and at bedtime.  . Multiple Vitamins-Minerals (MULTIVITAMIN WITH MINERALS) tablet Take 1 tablet by mouth daily.  . rizatriptan (MAXALT-MLT) 10 MG disintegrating tablet Take 1 tablet (10 mg total) by mouth as needed for migraine. May repeat in 2 hours if needed; do not take more than 2 in a 24 hr period  . VITAMIN D PO Take by mouth.   No facility-administered encounter medications on file as of 05/30/2020.    Follow-up: Return in about 1 month (around 06/30/2020) for Lab follow-up.   Noreene Larsson, NP

## 2020-06-01 DIAGNOSIS — R200 Anesthesia of skin: Secondary | ICD-10-CM | POA: Insufficient documentation

## 2020-06-07 DIAGNOSIS — M549 Dorsalgia, unspecified: Secondary | ICD-10-CM | POA: Insufficient documentation

## 2020-06-19 ENCOUNTER — Ambulatory Visit: Payer: 59 | Admitting: Orthopaedic Surgery

## 2020-06-19 ENCOUNTER — Other Ambulatory Visit: Payer: Self-pay

## 2020-06-19 ENCOUNTER — Ambulatory Visit: Payer: 59

## 2020-06-19 ENCOUNTER — Encounter: Payer: Self-pay | Admitting: Orthopaedic Surgery

## 2020-06-19 VITALS — BP 112/70 | HR 76 | Ht 65.0 in | Wt 138.9 lb

## 2020-06-19 DIAGNOSIS — G8929 Other chronic pain: Secondary | ICD-10-CM

## 2020-06-19 DIAGNOSIS — M25531 Pain in right wrist: Secondary | ICD-10-CM

## 2020-06-19 DIAGNOSIS — M25572 Pain in left ankle and joints of left foot: Secondary | ICD-10-CM | POA: Diagnosis not present

## 2020-06-19 DIAGNOSIS — M79642 Pain in left hand: Secondary | ICD-10-CM

## 2020-06-19 DIAGNOSIS — G5622 Lesion of ulnar nerve, left upper limb: Secondary | ICD-10-CM | POA: Diagnosis not present

## 2020-06-19 NOTE — Progress Notes (Signed)
mr

## 2020-06-19 NOTE — Progress Notes (Signed)
Subjective:    Patient ID: Amber Simpson, female    DOB: Sep 22, 1965, 55 y.o.   MRN: [id number]  HPI She has several problems.  1.  She complains of pain of the left ankle for many months.  She has some feeling of instability with it at times.  She has no recent trauma, no swelling, no redness.  It bothers her at times and other times it does not.  She is concerned about it.  2. She fell about five months ago on the right wrist and had pain for a while.  Now she has pain when grasping certain objects and pushing herself up off the floor.  It hurts in the snuff box area.  It is not a constant pain.  She has no redness, no numbness.  3.  She has weakness and pain going to the left little finger and part of the ring finger.  This has been going on for several months.  She has no trauma.  She has no swelling or redness.  She is concerned about numbness.  She was recently seen at [facility] and I have read the notes.   Review of Systems  Constitutional: Positive for activity change.  Musculoskeletal: Positive for arthralgias and gait problem.  All other systems reviewed and are negative.  For Review of Systems, all other systems reviewed and are negative.  The following is a summary of the past history medically, past history surgically, known current medicines, social history and family history.  This information is gathered electronically by the computer from prior information and documentation.  I review this each visit and have found including this information at this point in the chart is beneficial and informative.   Past Medical History:  Diagnosis Date  . Allergy    sutures- she states she her wounds open up around the 3rd day post-op  . Migraines   . Vertigo     Past Surgical History:  Procedure Laterality Date  . BREAST REDUCTION SURGERY Bilateral   . MOUTH SURGERY    . 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  TONSILLECTOMY      Current Outpatient Medications on File Prior to Visit  Medication Sig Dispense Refill  . Ascorbic Acid (VITAMIN C PO) Take by mouth.    Marland Kitchen glucosamine-chondroitin 500-400 MG tablet Take 1 tablet by mouth in the morning and at bedtime.    . Multiple Vitamins-Minerals (MULTIVITAMIN WITH MINERALS) tablet Take 1 tablet by mouth daily.    . rizatriptan (MAXALT-MLT) 10 MG disintegrating tablet Take 1 tablet (10 mg total) by mouth as needed for migraine. May repeat in 2 hours if needed; do not take more than 2 in a 24 hr period 10 tablet 0  . VITAMIN D PO Take by mouth.     No current facility-administered medications on file prior to visit.    Social History   Socioeconomic History  . Marital status: Married    Spouse name: Not on file  . Number of children: Not on file  . Years of education: Not on file  . Highest education level: Not on file  Occupational History  . Occupation: [employer]- Part time  Tobacco Use  . Smoking status: Former Smoker    Packs/day: 1.00    Years: 15.00    Pack years: 15.00    Types: Cigarettes    Quit date: 05/12/2017    Years since quitting: 3.1  .  Smokeless tobacco: Never Used  Vaping Use  . Vaping Use: Never used  Substance and Sexual Activity  . Alcohol use: Yes    Comment: occasional wine or margarita; once per week  . Drug use: Not Currently  . Sexual activity: Not on file  Other Topics Concern  . Not on file  Social History Narrative  . Not on file   Social Determinants of Health   Financial Resource Strain: Not on file  Food Insecurity: Not on file  Transportation Needs: Not on file  Physical Activity: Not on file  Stress: Not on file  Social Connections: Not on file  Intimate Partner Violence: Not on file    Family History  Problem Relation Age of Onset  . Heart attack Mother   . Diabetes Father   . Cancer Father        lung cancer  . Diabetes Brother   . Kidney disease Brother     BP 112/70   Pulse 76   Ht  5\' 5"  (1.651 m)   Wt 138 lb 14.2 oz (63 kg)   BMI 23.11 kg/m   Body mass index is 23.11 kg/m.      Objective:   Physical Exam Vitals and nursing note reviewed. Exam conducted with a chaperone present.  Constitutional:      Appearance: She is well-developed and well-nourished.  HENT:     Head: Normocephalic and atraumatic.  Eyes:     Extraocular Movements: EOM normal.     Conjunctiva/sclera: Conjunctivae normal.     Pupils: Pupils are equal, round, and reactive to light.  Cardiovascular:     Rate and Rhythm: Normal rate and regular rhythm.     Pulses: Intact distal pulses.  Pulmonary:     Effort: Pulmonary effort is normal.  Abdominal:     Palpations: Abdomen is soft.  Musculoskeletal:       Arms:     Cervical back: Normal range of motion and neck supple.       Legs:  Skin:    General: Skin is warm and dry.  Neurological:     Mental Status: She is alert and oriented to person, place, and time.     Cranial Nerves: No cranial nerve deficit.     Motor: No abnormal muscle tone.     Coordination: Coordination normal.     Deep Tendon Reflexes: Reflexes are normal and symmetric. Reflexes normal.  Psychiatric:        Mood and Affect: Mood and affect normal.        Behavior: Behavior normal.        Thought Content: Thought content normal.        Judgment: Judgment normal.    X-rays were done of the left ankle and right wrist with scaphoid view, reported separately.       Assessment & Plan:   Encounter Diagnoses  Name Primary?  . Chronic pain of left ankle Yes  . Pain in right wrist   . Pain in left hand   . Tardy ulnar nerve palsy, left    I will get MRI of the right wrist to rule out fracture or ligamentous injury.  I will get EMGs to rule out ulnar nerve tardy.  I have asked her to get high top shoes for the ankle pain.  I will begin naprosyn 500 po bid pc.  Return in two weeks.  Call if any problem.  Precautions discussed.   Electronically  Signed , MD 2/8/20224:10  PM

## 2020-06-29 ENCOUNTER — Encounter: Payer: Self-pay | Admitting: Cardiovascular Disease

## 2020-06-29 ENCOUNTER — Ambulatory Visit: Payer: 59 | Admitting: Cardiovascular Disease

## 2020-06-29 ENCOUNTER — Other Ambulatory Visit: Payer: Self-pay

## 2020-06-29 VITALS — BP 114/72 | HR 74 | Ht 65.0 in | Wt 138.8 lb

## 2020-06-29 DIAGNOSIS — I493 Ventricular premature depolarization: Secondary | ICD-10-CM | POA: Diagnosis not present

## 2020-06-29 NOTE — Patient Instructions (Signed)
Medication Instructions:  Your physician recommends that you continue on your current medications as directed. Please refer to the Current Medication list given to you today.  *If you need a refill on your cardiac medications before your next appointment, please call your pharmacy*   Lab Work: none If you have labs (blood work) drawn today and your tests are completely normal, you will receive your results only by: Marland Kitchen MyChart Message (if you have MyChart) OR . A paper copy in the mail If you have any lab test that is abnormal or we need to change your treatment, we will call you to review the results.   Testing/Procedures: none   Follow-Up: At [facility], you and your health needs are our priority.  As part of our continuing mission to provide you with exceptional heart care, we have created designated Provider Care Teams.  These Care Teams include your primary Cardiologist (physician) and Advanced Practice Providers (APPs -  Physician Assistants and Nurse Practitioners) who all work together to provide you with the care you need, when you need it.  We recommend signing up for the patient portal called "MyChart".  Sign up information is provided on this After Visit Summary.  MyChart is used to connect with patients for Virtual Visits (Telemedicine).  Patients are able to view lab/test results, encounter notes, upcoming appointments, etc.  Non-urgent messages can be sent to your provider as well.   To learn more about what you can do with MyChart, go to [email or web address].    Your next appointment:   AS NEEDED  The format for your next appointment:   In Person  Provider:   You may see Dr. Elease Hashimoto or one of the following Advanced Practice Providers on your designated Care Team:    Tereso Newcomer, PA-C  [place], [place]

## 2020-06-29 NOTE — Progress Notes (Signed)
Cardiology Office Note:    Date:  06/29/2020   ID:  Amber Simpson, DOB 08-Aug-1965, MRN [id number]  PCP:  Heather Roberts, NP  [facility] Cardiologist:  Marajade Lei  [facility] Electrophysiologist:  None   Referring MD: No ref. provider found   Chief Complaint  Patient presents with  . Palpitations    Nov. 16, 2021:   Amber Simpson is a 55 y.o. female with a hx of an abnormal ECG  We were asked to see her by Dr. Katherene Ponto for further evaluation ofher abnormal ECG  Has had some tingling in her left hand Has had some palpitations . Has some fatigue , has DOE ,   Exercises regularly ,   Has moved to the [place] 2 years ago   Mother died at age 25 ,  Due to MI / cardiac arrest Brother died 2 months ago at age 30.   Still able to get out and walk . Walks daily  But gets fatigued quicker .   Seems to be worse after her covid Vaccine in July   Has significant constipation, Some blood tinged on sides of stool.   She has worn a monitor  Feb. 18, 2022: Amber Simpson is seen today for follow up of her palpitations .  Her lipid levels from November, 2021 showing total cholesterol of 143.  The HDL is 57.  Triglyceride level is 53.  LDL is 75.  TSH level was 1.29.  Has had tigling of her left had ( 3,4,5 fingers) is going up her arm  Is scheduled for EMGs   Past Medical History:  Diagnosis Date  . Allergy    sutures- she states she her wounds open up around the 3rd day post-op  . Migraines   . Vertigo     Past Surgical History:  Procedure Laterality Date  . BREAST REDUCTION SURGERY Bilateral   . MOUTH SURGERY    . 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 TONSILLECTOMY      Current Medications: Current Meds  Medication Sig  . Ascorbic Acid (VITAMIN C PO) Take by mouth.  Marland Kitchen glucosamine-chondroitin 500-400 MG tablet Take 1 tablet by mouth in the morning and at bedtime.  . Multiple Vitamins-Minerals (MULTIVITAMIN WITH  MINERALS) tablet Take 1 tablet by mouth daily.  Marland Kitchen VITAMIN D PO Take by mouth.  . [DISCONTINUED] rizatriptan (MAXALT-MLT) 10 MG disintegrating tablet Take 1 tablet (10 mg total) by mouth as needed for migraine. May repeat in 2 hours if needed; do not take more than 2 in a 24 hr period     Allergies:   Other   Social History   Socioeconomic History  . Marital status: Married    Spouse name: Not on file  . Number of children: Not on file  . Years of education: Not on file  . Highest education level: Not on file  Occupational History  . Occupation: [employer]- Part time  Tobacco Use  . Smoking status: Former Smoker    Packs/day: 1.00    Years: 15.00    Pack years: 15.00    Types: Cigarettes    Quit date: 05/12/2017    Years since quitting: 3.1  . Smokeless tobacco: Never Used  Vaping Use  . Vaping Use: Never used  Substance and Sexual Activity  . Alcohol use: Yes    Comment: occasional wine or margarita; once per week  . Drug use: Not Currently  . Sexual activity:  Not on file  Other Topics Concern  . Not on file  Social History Narrative  . Not on file   Social Determinants of Health   Financial Resource Strain: Not on file  Food Insecurity: Not on file  Transportation Needs: Not on file  Physical Activity: Not on file  Stress: Not on file  Social Connections: Not on file     Family History: The patient's family history includes Cancer in her father; Diabetes in her brother and father; Heart attack in her mother; Kidney disease in her brother.  ROS:   Please see the history of present illness.     All other systems reviewed and are negative.  EKGs/Labs/Other Studies Reviewed:    The following studies were reviewed today:   EKG:     Recent Labs: 03/27/2020: ALT 8; BUN 11; Creatinine, Ser 0.66; Hemoglobin 12.9; Platelets 291; Potassium 4.0; Sodium 141; TSH 1.390  Recent Lipid Panel    Component Value Date/Time   CHOL 143 03/27/2020 0942   TRIG 53 03/27/2020 0942    HDL 57 03/27/2020 0942   CHOLHDL 2.5 03/27/2020 0942   LDLCALC 75 03/27/2020 0942     Risk Assessment/Calculations:     Physical Exam:    Physical Exam: Blood pressure 114/72, pulse 74, height 5\' 5"  (1.651 m), weight 138 lb 12.8 oz (63 kg), SpO2 97 %.  GEN:  Well nourished, well developed in no acute distress HEENT: Normal NECK: No JVD; No carotid bruits LYMPHATICS: No lymphadenopathy CARDIAC: RRR , no murmurs, rubs, gallops RESPIRATORY:  Clear to auscultation without rales, wheezing or rhonchi  ABDOMEN: Soft, non-tender, non-distended MUSCULOSKELETAL:  No edema; No deformity  SKIN: Warm and dry NEUROLOGIC:  Alert and oriented x 3  ASSESSMENT:    No diagnosis found. PLAN:    1.   Palpitations:    Better ,  Has been found to have PVCs   2.  Constipation:    Better , is drinking more water      Shared Decision Making/Informed Consent        Medication Adjustments/Labs and Tests Ordered: Current medicines are reviewed at length with the patient today.  Concerns regarding medicines are outlined above.  No orders of the defined types were placed in this encounter.  No orders of the defined types were placed in this encounter.    Patient Instructions  Medication Instructions:  Your physician recommends that you continue on your current medications as directed. Please refer to the Current Medication list given to you today.  *If you need a refill on your cardiac medications before your next appointment, please call your pharmacy*   Lab Work: none If you have labs (blood work) drawn today and your tests are completely normal, you will receive your results only by: MyChart Message (if you have MyChart) OR . A paper copy in the mail If you have any lab test that is abnormal or we need to change your treatment, we will call you to review the results.   Testing/Procedures: none   Follow-Up: At [facility], you and your health needs are our priority.  As  part of our continuing mission to provide you with exceptional heart care, we have created designated Provider Care Teams.  These Care Teams include your primary Cardiologist (physician) and Advanced Practice Providers (APPs -  Physician Assistants and Nurse Practitioners) who all work together to provide you with the care you need, when you need it.  We recommend signing up for the  patient portal called "MyChart".  Sign up information is provided on this After Visit Summary.  MyChart is used to connect with patients for Virtual Visits (Telemedicine).  Patients are able to view lab/test results, encounter notes, upcoming appointments, etc.  Non-urgent messages can be sent to your provider as well.   To learn more about what you can do with MyChart, go to [email or web address].    Your next appointment:   AS NEEDED  The format for your next appointment:   In Person  Provider:   You may see Dr. Elease Hashimoto or one of the following Advanced Practice Providers on your designated Care Team:    Tereso Newcomer, PA-C  Chelsea Aus, [place]       Signed, Kristeen Miss, MD  06/29/2020 2:51 PM    [facility]

## 2020-07-02 ENCOUNTER — Ambulatory Visit (INDEPENDENT_AMBULATORY_CARE_PROVIDER_SITE_OTHER): Payer: 59 | Admitting: Nurse Practitioner

## 2020-07-02 ENCOUNTER — Other Ambulatory Visit: Payer: Self-pay

## 2020-07-02 ENCOUNTER — Encounter: Payer: Self-pay | Admitting: Nurse Practitioner

## 2020-07-02 VITALS — BP 106/72 | HR 70 | Temp 98.5°F | Resp 20 | Ht 66.0 in | Wt 141.0 lb

## 2020-07-02 DIAGNOSIS — Z7689 Persons encountering health services in other specified circumstances: Secondary | ICD-10-CM

## 2020-07-02 DIAGNOSIS — G43009 Migraine without aura, not intractable, without status migrainosus: Secondary | ICD-10-CM

## 2020-07-02 DIAGNOSIS — Z1231 Encounter for screening mammogram for malignant neoplasm of breast: Secondary | ICD-10-CM | POA: Diagnosis not present

## 2020-07-02 DIAGNOSIS — Z139 Encounter for screening, unspecified: Secondary | ICD-10-CM | POA: Diagnosis not present

## 2020-07-02 MED ORDER — NURTEC 75 MG PO TBDP: 1.0000 | ORAL_TABLET | ORAL | 1 refills | Status: DC

## 2020-07-02 NOTE — Progress Notes (Signed)
Acute Office Visit  Subjective:    Patient ID: Amber Simpson, female    DOB: 08/18/1965, 55 y.o.   MRN: [id number]  Chief Complaint  Patient presents with  . Headache    Migraine headaches, dizziness, had labs at OBGYN (all normal) will have a copy faxed.     HPI Patient is in today for lab f/u. There are no labs to review today because she had them drawn elsewhere and didn't bring them. She showed me her labs and she had pap, prolactin, and TSH.  She has been having migraine and dizziness. She was started on maxalt at her last visit.  She states her migraines are triggered by lack of sleep and dehydration.   Past Medical History:  Diagnosis Date  . Allergy    sutures- she states she her wounds open up around the 3rd day post-op  . Migraines   . Vertigo     Past Surgical History:  Procedure Laterality Date  . BREAST REDUCTION SURGERY Bilateral   . MOUTH SURGERY    . 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 TONSILLECTOMY      Family History  Problem Relation Age of Onset  . Heart attack Mother   . Diabetes Father   . Cancer Father        lung cancer  . Diabetes Brother   . Kidney disease Brother     Social History   Socioeconomic History  . Marital status: Married    Spouse name: Not on file  . Number of children: Not on file  . Years of education: Not on file  . Highest education level: Not on file  Occupational History  . Occupation: [employer]- Part time  Tobacco Use  . Smoking status: Former Smoker    Packs/day: 1.00    Years: 15.00    Pack years: 15.00    Types: Cigarettes    Quit date: 05/12/2017    Years since quitting: 3.1  . Smokeless tobacco: Never Used  Vaping Use  . Vaping Use: Never used  Substance and Sexual Activity  . Alcohol use: Yes    Comment: occasional wine or margarita; once per week  . Drug use: Not Currently  . Sexual activity: Not on file  Other Topics Concern  . Not on file  Social History  Narrative  . Not on file   Social Determinants of Health   Financial Resource Strain: Not on file  Food Insecurity: Not on file  Transportation Needs: Not on file  Physical Activity: Not on file  Stress: Not on file  Social Connections: Not on file  Intimate Partner Violence: Not on file    Outpatient Medications Prior to Visit  Medication Sig Dispense Refill  . Ascorbic Acid (VITAMIN C PO) Take by mouth.    Marland Kitchen glucosamine-chondroitin 500-400 MG tablet Take 1 tablet by mouth in the morning and at bedtime.    . Multiple Vitamins-Minerals (MULTIVITAMIN WITH MINERALS) tablet Take 1 tablet by mouth daily.    Marland Kitchen VITAMIN D PO Take by mouth.     No facility-administered medications prior to visit.    Allergies  Allergen Reactions  . Other Other (See Comments)    Suture stitching -     Review of Systems  Constitutional: Negative.   Respiratory: Negative.   Cardiovascular: Negative.   Neurological: Positive for headaches.  Psychiatric/Behavioral: Negative.        Objective:  Physical Exam Constitutional:      Appearance: She is well-developed.  Eyes:     General: No visual field deficit. Cardiovascular:     Rate and Rhythm: Normal rate and regular rhythm.     Heart sounds: Normal heart sounds.  Pulmonary:     Effort: Pulmonary effort is normal.     Breath sounds: Normal breath sounds.  Neurological:     Mental Status: She is alert.     GCS: GCS eye subscore is 4. GCS verbal subscore is 5. GCS motor subscore is 6.     Cranial Nerves: No cranial nerve deficit, dysarthria or facial asymmetry.  Psychiatric:        Mood and Affect: Mood normal.        Speech: Speech normal.        Behavior: Behavior normal.     BP 106/72   Pulse 70   Temp 98.5 F (36.9 C)   Resp 20   Ht 5\' 6"  (1.676 m)   Wt 141 lb (64 kg)   SpO2 96%   BMI 22.76 kg/m  Wt Readings from Last 3 Encounters:  07/02/20 141 lb (64 kg)  06/29/20 138 lb 12.8 oz (63 kg)  06/19/20 138 lb 14.2 oz (63  kg)    Health Maintenance Due  Topic Date Due  . Hepatitis C Screening  Never done  . HIV Screening  Never done  . TETANUS/TDAP  Never done  . PAP SMEAR-Modifier  Never done  . COLONOSCOPY (Pts 45-48yrs Insurance coverage will need to be confirmed)  Never done  . MAMMOGRAM  Never done  . INFLUENZA VACCINE  Never done  . COVID-19 Vaccine (3 - Booster for Pfizer series) 02/15/2020    There are no preventive care reminders to display for this patient.   Lab Results  Component Value Date   TSH 1.390 03/27/2020   Lab Results  Component Value Date   WBC 5.7 03/27/2020   HGB 12.9 03/27/2020   HCT 39.0 03/27/2020   MCV 88 03/27/2020   PLT 291 03/27/2020   Lab Results  Component Value Date   NA 141 03/27/2020   K 4.0 03/27/2020   CO2 26 03/27/2020   GLUCOSE 86 03/27/2020   BUN 11 03/27/2020   CREATININE 0.66 03/27/2020   BILITOT <0.2 03/27/2020   ALKPHOS 71 03/27/2020   AST 19 03/27/2020   ALT 8 03/27/2020   PROT 6.9 03/27/2020   ALBUMIN 4.5 03/27/2020   CALCIUM 9.3 03/27/2020   Lab Results  Component Value Date   CHOL 143 03/27/2020   Lab Results  Component Value Date   HDL 57 03/27/2020   Lab Results  Component Value Date   LDLCALC 75 03/27/2020   Lab Results  Component Value Date   TRIG 53 03/27/2020   Lab Results  Component Value Date   CHOLHDL 2.5 03/27/2020   No results found for: HGBA1C     Assessment & Plan:   Problem List Items Addressed This Visit   None      No orders of the defined types were placed in this encounter.    03/29/2020, NP

## 2020-07-02 NOTE — Assessment & Plan Note (Signed)
-  with migraines she has vomiting, light/sound sensitivity and left-sided head pain -4-6 migraine days per month -Rx. nurtec for prophylaxis -she didn't take maxalt over concerns for side effects

## 2020-07-03 ENCOUNTER — Encounter: Payer: Self-pay | Admitting: Internal Medicine

## 2020-07-03 ENCOUNTER — Ambulatory Visit: Payer: 59 | Admitting: Orthopaedic Surgery

## 2020-07-04 ENCOUNTER — Ambulatory Visit: Payer: 59 | Admitting: Nurse Practitioner

## 2020-07-05 LAB — LIPID PANEL WITH LDL/HDL RATIO
Cholesterol, Total: 155 mg/dL (ref 100–199)
HDL: 61 mg/dL (ref >39)
LDL Chol Calc (NIH): 77 mg/dL (ref 0–99)
LDL/HDL Ratio: 1.3 ratio (ref 0.0–3.2)
Triglycerides: 91 mg/dL (ref 0–149)
VLDL Cholesterol Cal: 17 mg/dL (ref 5–40)

## 2020-07-05 LAB — CMP14+EGFR
ALT: 9 IU/L (ref 0–32)
AST: 25 IU/L (ref 0–40)
Albumin/Globulin Ratio: 1.9 (ref 1.2–2.2)
Albumin: 4.8 g/dL (ref 3.8–4.9)
Alkaline Phosphatase: 71 IU/L (ref 44–121)
BUN/Creatinine Ratio: 17 (ref 9–23)
BUN: 15 mg/dL (ref 6–24)
Bilirubin Total: 0.4 mg/dL (ref 0.0–1.2)
CO2: 23 mmol/L (ref 20–29)
Calcium: 9.6 mg/dL (ref 8.7–10.2)
Chloride: 102 mmol/L (ref 96–106)
Creatinine, Ser: 0.86 mg/dL (ref 0.57–1.00)
GFR calc Af Amer: 89 mL/min/{1.73_m2} (ref >59)
GFR calc non Af Amer: 77 mL/min/{1.73_m2} (ref >59)
Globulin, Total: 2.5 g/dL (ref 1.5–4.5)
Glucose: 88 mg/dL (ref 65–99)
Potassium: 4.1 mmol/L (ref 3.5–5.2)
Sodium: 141 mmol/L (ref 134–144)
Total Protein: 7.3 g/dL (ref 6.0–8.5)

## 2020-07-05 LAB — CBC WITH DIFFERENTIAL/PLATELET
Basophils Absolute: 0 10*3/uL (ref 0.0–0.2)
Basos: 1 %
EOS (ABSOLUTE): 0.2 10*3/uL (ref 0.0–0.4)
Eos: 4 %
Hematocrit: 40.6 % (ref 34.0–46.6)
Hemoglobin: 13.3 g/dL (ref 11.1–15.9)
Immature Grans (Abs): 0 10*3/uL (ref 0.0–0.1)
Immature Granulocytes: 0 %
Lymphocytes Absolute: 1.7 10*3/uL (ref 0.7–3.1)
Lymphs: 34 %
MCH: 28.9 pg (ref 26.6–33.0)
MCHC: 32.8 g/dL (ref 31.5–35.7)
MCV: 88 fL (ref 79–97)
Monocytes Absolute: 0.4 10*3/uL (ref 0.1–0.9)
Monocytes: 8 %
Neutrophils Absolute: 2.7 10*3/uL (ref 1.4–7.0)
Neutrophils: 53 %
Platelets: 341 10*3/uL (ref 150–450)
RBC: 4.6 x10E6/uL (ref 3.77–5.28)
RDW: 12.9 % (ref 11.7–15.4)
WBC: 5.1 10*3/uL (ref 3.4–10.8)

## 2020-07-05 LAB — HCV AB W/RFLX TO VERIFICATION: HCV Ab: <0.1 s/co ratio (ref 0.0–0.9)

## 2020-07-05 LAB — HIV ANTIBODY (ROUTINE TESTING W REFLEX): HIV Screen 4th Generation wRfx: NONREACTIVE

## 2020-07-05 LAB — HCV INTERPRETATION

## 2020-07-05 NOTE — Progress Notes (Signed)
All of her labs are perfect!

## 2020-07-06 ENCOUNTER — Other Ambulatory Visit: Payer: Self-pay | Admitting: Nurse Practitioner

## 2020-07-06 NOTE — Progress Notes (Signed)
Prior auth pending. Her insurance prefers [employer] OR [employer]

## 2020-07-30 ENCOUNTER — Other Ambulatory Visit: Payer: 59

## 2020-07-31 ENCOUNTER — Ambulatory Visit: Payer: 59

## 2020-08-02 ENCOUNTER — Inpatient Hospital Stay: Admission: RE | Admit: 2020-08-02 | Payer: 59 | Source: Ambulatory Visit

## 2020-08-03 ENCOUNTER — Encounter: Payer: Self-pay | Admitting: Physical Medicine and Rehabilitation

## 2020-08-03 ENCOUNTER — Other Ambulatory Visit: Payer: Self-pay

## 2020-08-03 ENCOUNTER — Ambulatory Visit (INDEPENDENT_AMBULATORY_CARE_PROVIDER_SITE_OTHER): Payer: 59 | Admitting: Physical Medicine and Rehabilitation

## 2020-08-03 DIAGNOSIS — R202 Paresthesia of skin: Secondary | ICD-10-CM | POA: Diagnosis not present

## 2020-08-03 NOTE — Progress Notes (Signed)
Numbness and tingling left third, fourth, and fifth fingers. Burning pain left arm and left side of neck.  Right hand dominant No lotion per patient Numeric Pain Rating Scale and Functional Assessment Average Pain 4   In the last MONTH (on 0-10 scale) has pain interfered with the following?  1. General activity like being  able to carry out your everyday physical activities such as walking, climbing stairs, carrying groceries, or moving a chair?  Rating(4)

## 2020-08-06 ENCOUNTER — Ambulatory Visit (INDEPENDENT_AMBULATORY_CARE_PROVIDER_SITE_OTHER): Payer: 59 | Admitting: *Deleted

## 2020-08-06 ENCOUNTER — Other Ambulatory Visit: Payer: Self-pay

## 2020-08-06 VITALS — Ht 66.0 in | Wt 143.8 lb

## 2020-08-06 DIAGNOSIS — Z1211 Encounter for screening for malignant neoplasm of colon: Secondary | ICD-10-CM

## 2020-08-06 MED ORDER — PEG 3350-KCL-NA BICARB-NACL 420 G PO SOLR: 4000.0000 mL | Freq: Once | ORAL | 0 refills | Status: AC

## 2020-08-06 NOTE — Patient Instructions (Addendum)
Covid test: 08/29/2020 at 9:05 AM.  Amber Simpson   April 06, 1966 MRN: [id number] Procedure Date: 08/31/2020 Arrival Time:   You will receive a call from the hospital a few days before your procedure.    [facility] of [facility]  PREPARATION FOR COLONOSCOPY WITH TRI-LYTE PREP   Please notify us immediately if you are diabetic, take iron supplements, or if you are on Coumadin or any other blood thinners.   Please hold the following medications: n/a  You will need to purchase 1 fleet enema and 1 box of Bisacodyl 5mg  tablets.   PROCEDURE IS SCHEDULED FOR Amber Simpson AS FOLLOWS:  Procedure Date: 08/31/2020 Time to register: You will receive a call from the hospital a few days before your procedure. Place to register: 09/02/2020 Short Stay Scheduled provider: Dr. [facility]   2 DAYS BEFORE PROCEDURE:  DATE: 08/29/2020   DAY: Wednesday Begin clear liquid diet AFTER your lunch meal. NO SOLID FOODS!   1 DAY BEFORE PROCEDURE:  DATE: 08/30/2020   DAY: Thursday  Continue clear liquids the entire day - NO SOLID FOOD.   Diabetic medications adjustments for today: n/a  At 12:00pm (noon): Take 2 (two) Dulcolax (Bisacodyl) tablets  At 2:00pm: Start drinking your solution. Try to drink 1 (one) 8 ounce glass every 10-15 minutes, until you have consumed HALF the jug. (You should complete the first 1/2 of the jug in 2 hours. Wait 30 minutes, then drink 3-4 more glasses of the solution. Your stools should be clear; if not, you may have to consume the rest of the jug.   One hour after completing the solution: take the last 2 (two) Dulcolax (Bisacodyl) tablets, with a clear liquid.  YOU MUST DRINK PLENTY OF CLEAR LIQUIDS DURING YOUR PREP TO REDUCE RISKS OF KIDNEY FAILURE.   Continue clear liquids only until 4 hours before your scheduled procedure.  Nothing by mouth 4 hours before your procedure. EXCEPTION:  If you take medications for your heart, blood pressure or breathing,  you may take these medications with a small amount of clear liquid.      DAY OF PROCEDURE:   DATE: 08/31/2020       DAY: Friday The morning of your procedure give yourself 1 (one) Fleet Enema, at least 1 hour before going to the hospital.   You may take Tylenol products. Please continue your regular medications unless we have instructed otherwise.   Diabetic medications adjustments for today. n/a  Someone MUST be available to drive you home; the hospital will cancel this appointment if you do not have a driver.   Please call the office if you have any questions (Dept: [phone]).  Please see below for Dietary Information.  CLEAR LIQUIDS INCLUDE:  Water Jello (NOT red in color)   Ice Popsicles (NOT red in color)   Tea (sugar ok, no milk/cream) Powdered fruit flavored drinks  Coffee (sugar ok, no milk/cream) Gatorade/ Lemonade/ Kool-Aid  (NOT red in color)   Juice: apple, white grape, white cranberry Soft drinks  Clear bullion, consomme, broth (fat free beef/chicken/vegetable)  Carbonated beverages (any kind)  Strained chicken noodle soup Hard Candy   REMEMBER: Clear liquids are liquids that will allow you to see your fingers on the other side of a clear glass. Be sure liquids are NOT red in color, and not cloudy, but CLEAR.   DO NOT EAT OR DRINK ANY OF THE FOLLOWING:  Dairy products of any kind   Cranberry juice Tomato juice / V8  juice   Grapefruit juice Orange juice     Red grape juice  Do not eat any solid foods, including such foods as: cereal, oatmeal, yogurt, fruits, vegetables, creamed soups, eggs, bread, etc.    HELPFUL HINTS FOR DRINKING PREP SOLUTION:   Make sure prep is extremely cold. Refrigerate the night before. You may also put in the freezer.   You may try mixing some Crystal Light or Country Time Lemonade if you prefer. Mix in small amounts; add more if necessary.  Try drinking through a straw  Rinse mouth with water or a mouthwash between glasses, to  remove after-taste.  Try sipping on a cold beverage /ice/ popsicles between glasses of prep  Place a piece of sugar-free hard candy in mouth between glasses  If you become nauseated, try consuming smaller amounts, or stretch out the time between glasses. Stop for 30-60 minutes, then slowly start back drinking    You may call the office (Dept: [phone]) before 5:00pm, or page the doctor on call after 5:00pm ([phone]), for further instructions, if necessary.   OTHER INSTRUCTIONS  You will need a responsible adult at least 54 years of age to accompany you and drive you home. This person must remain in the waiting room during your procedure.  Wear loose fitting clothing that is easily removed.  Leave jewelry and other valuables at home.   Remove all body piercing jewelry and leave at home.  Total time from sign-in until discharge is approximately 2-3 hours.  You should go home directly after your procedure and rest. You can resume normal activities the day after your procedure.  The day of your procedure you should not:  Drive  Make legal decisions  Operate machinery  Drink alcohol  Return to work

## 2020-08-06 NOTE — Progress Notes (Addendum)
Gastroenterology Pre-Procedure Review  Request Date: 08/06/2020 Requesting Physician: Bjorn Pippin, NP @ [facility], no previous TCS  PATIENT REVIEW QUESTIONS: The patient responded to the following health history questions as indicated:    1. Diabetes Melitis: no 2. Joint replacements in the past 12 months: no 3. Major health problems in the past 3 months: no 4. Has an artificial valve or MVP: no 5. Has a defibrillator: no 6. Has been advised in past to take antibiotics in advance of a procedure like teeth cleaning: no 7. Family history of colon cancer: no  8. Alcohol Use: yes, 2 margaritas every 2 weeks 9. Illicit drug Use: no 10. History of sleep apnea: no  11. History of coronary artery or other vascular stents placed within the last 12 months: no 12. History of any prior anesthesia complications: no 13. Body mass index is 23.21 kg/m.    MEDICATIONS & ALLERGIES:    Patient reports the following regarding taking any blood thinners:   Plavix? no Aspirin? no Coumadin? no Brilinta? no Xarelto? no Eliquis? no Pradaxa? no Savaysa? no Effient? no  Patient confirms/reports the following medications:  Current Outpatient Medications  Medication Sig Dispense Refill  . glucosamine-chondroitin 500-400 MG tablet Take 1 tablet by mouth once a week.    . Multiple Vitamins-Minerals (MULTIVITAMIN WITH MINERALS) tablet Take 1 tablet by mouth daily.    Marland Kitchen VITAMIN D PO Take by mouth once a week.     No current facility-administered medications for this visit.    Patient confirms/reports the following allergies:  Allergies  Allergen Reactions  . Other Other (See Comments)    Suture stitching -     No orders of the defined types were placed in this encounter.   AUTHORIZATION INFORMATION Primary Insurance: [facility] ,  ID #:[id number],   Group #: [id number] Pre-Cert / Auth required: No, not required  Secondary Insurance: [place],  [place] #: [id number] Pre-Cert / Berkley Harvey required:  No, not required  SCHEDULE INFORMATION: Procedure has been scheduled as follows:  Date: 08/31/2020, Time: PM procedure Location: [facility] with Dr. Marletta Lor  This Gastroenterology Pre-Precedure Review Form is being routed to the following provider(s): Wynne Dust, NP

## 2020-08-07 ENCOUNTER — Ambulatory Visit: Payer: 59 | Admitting: Orthopaedic Surgery

## 2020-08-07 NOTE — Progress Notes (Signed)
Ok to schedule.  ASA I/II 

## 2020-08-07 NOTE — Progress Notes (Signed)
Amber Simpson - 55 y.o. female MRN [id number]  Date of birth: 12-Aug-1965  Office Visit Note: Visit Date: 08/03/2020 PCP: Heather Roberts, NP Referred by: Heather Roberts, NP  Subjective: Chief Complaint  Patient presents with  . Left Hand - Numbness   HPI:  Amber Simpson is a 55 y.o. female who comes in today at the request of Dr. Darreld Mclean for electrodiagnostic study of the Left upper extremities.  Patient is Right hand dominant.  She reports chronic worsening severe numbness and tingling in the left third fourth and fifth digits but predominantly the fourth and fifth digits.  She does get some left-sided neck pain but no real frank radicular symptoms.  Some history of right wrist pain with fall onto the right hand.  No prior electrodiagnostic studies.  Dr. Hilda Lias was most concerned with ulnar nerve neuropathy at the elbow.   ROS Otherwise per HPI.  Assessment & Plan: Visit Diagnoses:    ICD-10-CM   1. Paresthesia of skin  R20.2 NCV with EMG (electromyography)    Plan:  Impression: Essentially NORMAL electrodiagnostic study of the left upper limb.  There is no significant electrodiagnostic evidence of nerve entrapment (particularly ulnar nerve), brachial plexopathy or cervical radiculopathy.    As you know, purely sensory or demyelinating radiculopathies and chemical radiculitis may not be detected with this particular electrodiagnostic study.  Recommendations: 1.  Follow-up with referring physician. 2.  Continue current management of symptoms.  Meds & Orders: No orders of the defined types were placed in this encounter.   Orders Placed This Encounter  Procedures  . NCV with EMG (electromyography)    Follow-up: Return for [facility] as scheduled.   Procedures: No procedures performed  EMG & NCV Findings: Evaluation of the left ulnar motor nerve showed decreased conduction velocity (B Elbow-Wrist, 51 m/s).  All remaining nerves (as indicated in the following  tables) were within normal limits.    All examined muscles (as indicated in the following table) showed no evidence of electrical instability.    Impression: Essentially NORMAL electrodiagnostic study of the left upper limb.  There is no significant electrodiagnostic evidence of nerve entrapment (particularly ulnar nerve), brachial plexopathy or cervical radiculopathy.    As you know, purely sensory or demyelinating radiculopathies and chemical radiculitis may not be detected with this particular electrodiagnostic study.  Recommendations: 1.  Follow-up with referring physician. 2.  Continue current management of symptoms.  ___________________________ Naaman Plummer FAAPMR Board Certified, American Board of Physical Medicine and Rehabilitation    Nerve Conduction Studies Anti Sensory Summary Table   Stim Site NR Peak (ms) Norm Peak (ms) P-T Amp (V) Norm P-T Amp Site1 Site2 Delta-P (ms) Dist (cm) Vel (m/s) Norm Vel (m/s)  Left Median Acr Palm Anti Sensory (2nd Digit)  29.9C  Wrist    3.5 <3.6 33.2 >10 Wrist Palm 1.9 0.0    Palm    1.6 <2.0 50.1         Left Radial Anti Sensory (Base 1st Digit)  30.5C  Wrist    2.1 <3.1 30.5  Wrist Base 1st Digit 2.1 0.0    Left Ulnar Anti Sensory (5th Digit)  30C  Wrist    3.7 <3.7 37.9 >15.0 Wrist 5th Digit 3.7 14.0 38 >38   Motor Summary Table   Stim Site NR Onset (ms) Norm Onset (ms) O-P Amp (mV) Norm O-P Amp Site1 Site2 Delta-0 (ms) Dist (cm) Vel (m/s) Norm Vel (m/s)  Left Median Motor (Abd Poll Brev)  30.6C  Wrist    3.3 <4.2 10.1 >5 Elbow Wrist 3.7 18.5 50 >50  Elbow    7.0  6.4         Left Ulnar Motor (Abd Dig Min)  30.4C  Wrist    2.7 <4.2 10.7 >3 B Elbow Wrist 3.4 17.5 *51 >53  B Elbow    6.1  10.4  A Elbow B Elbow 1.5 10.0 67 >53  A Elbow    7.6  10.5          EMG   Side Muscle Nerve Root Ins Act Fibs Psw Amp Dur Poly Recrt Int Dennie Bible Comment  Left Abd Poll Brev Median C8-T1 Nml Nml Nml Nml Nml 0 Nml Nml   Left 1stDorInt Ulnar  C8-T1 Nml Nml Nml Nml Nml 0 Nml Nml   Left PronatorTeres Median C6-7 Nml Nml Nml Nml Nml 0 Nml Nml   Left Biceps Musculocut C5-6 Nml Nml Nml Nml Nml 0 Nml Nml   Left Deltoid Axillary C5-6 Nml Nml Nml Nml Nml 0 Nml Nml     Nerve Conduction Studies Anti Sensory Left/Right Comparison   Stim Site L Lat (ms) R Lat (ms) L-R Lat (ms) L Amp (V) R Amp (V) L-R Amp (%) Site1 Site2 L Vel (m/s) R Vel (m/s) L-R Vel (m/s)  Median Acr Palm Anti Sensory (2nd Digit)  29.9C  Wrist 3.5   33.2   Wrist Palm     Palm 1.6   50.1         Radial Anti Sensory (Base 1st Digit)  30.5C  Wrist 2.1   30.5   Wrist Base 1st Digit     Ulnar Anti Sensory (5th Digit)  30C  Wrist 3.7   37.9   Wrist 5th Digit 38     Motor Left/Right Comparison   Stim Site L Lat (ms) R Lat (ms) L-R Lat (ms) L Amp (mV) R Amp (mV) L-R Amp (%) Site1 Site2 L Vel (m/s) R Vel (m/s) L-R Vel (m/s)  Median Motor (Abd Poll Brev)  30.6C  Wrist 3.3   10.1   Elbow Wrist 50    Elbow 7.0   6.4         Ulnar Motor (Abd Dig Min)  30.4C  Wrist 2.7   10.7   B Elbow Wrist *51    B Elbow 6.1   10.4   A Elbow B Elbow 67    A Elbow 7.6   10.5            Waveforms:             Clinical History: No specialty comments available.     Objective:  VS:  HT:    WT:   BMI:     BP:   HR: bpm  TEMP: ( )  RESP:  Physical Exam Musculoskeletal:        General: No swelling, tenderness or deformity.     Comments: Inspection reveals no atrophy of the bilateral APB or FDI or hand intrinsics. There is no swelling, color changes, allodynia or dystrophic changes. There is 5 out of 5 strength in the bilateral wrist extension, finger abduction and long finger flexion. There is intact sensation to light touch in all dermatomal and peripheral nerve distributions. There is a negative Froment's test bilaterally.  There is a negative Hoffmann's test bilaterally.  Skin:    General: Skin is warm and dry.     Findings: No erythema or rash.  Neurological:  General: No focal deficit present.     Mental Status: She is alert and oriented to person, place, and time.     Motor: No weakness or abnormal muscle tone.     Coordination: Coordination normal.  Psychiatric:        Mood and Affect: Mood normal.        Behavior: Behavior normal.      Imaging: No results found.

## 2020-08-07 NOTE — Procedures (Signed)
EMG & NCV Findings: Evaluation of the left ulnar motor nerve showed decreased conduction velocity (B Elbow-Wrist, 51 m/s).  All remaining nerves (as indicated in the following tables) were within normal limits.    All examined muscles (as indicated in the following table) showed no evidence of electrical instability.    Impression: Essentially NORMAL electrodiagnostic study of the left upper limb.  There is no significant electrodiagnostic evidence of nerve entrapment (particularly ulnar nerve), brachial plexopathy or cervical radiculopathy.    As you know, purely sensory or demyelinating radiculopathies and chemical radiculitis may not be detected with this particular electrodiagnostic study.  Recommendations: 1.  Follow-up with referring physician. 2.  Continue current management of symptoms.  ___________________________ Naaman Plummer FAAPMR Board Certified, American Board of Physical Medicine and Rehabilitation    Nerve Conduction Studies Anti Sensory Summary Table   Stim Site NR Peak (ms) Norm Peak (ms) P-T Amp (V) Norm P-T Amp Site1 Site2 Delta-P (ms) Dist (cm) Vel (m/s) Norm Vel (m/s)  Left Median Acr Palm Anti Sensory (2nd Digit)  29.9C  Wrist    3.5 <3.6 33.2 >10 Wrist Palm 1.9 0.0    Palm    1.6 <2.0 50.1         Left Radial Anti Sensory (Base 1st Digit)  30.5C  Wrist    2.1 <3.1 30.5  Wrist Base 1st Digit 2.1 0.0    Left Ulnar Anti Sensory (5th Digit)  30C  Wrist    3.7 <3.7 37.9 >15.0 Wrist 5th Digit 3.7 14.0 38 >38   Motor Summary Table   Stim Site NR Onset (ms) Norm Onset (ms) O-P Amp (mV) Norm O-P Amp Site1 Site2 Delta-0 (ms) Dist (cm) Vel (m/s) Norm Vel (m/s)  Left Median Motor (Abd Poll Brev)  30.6C  Wrist    3.3 <4.2 10.1 >5 Elbow Wrist 3.7 18.5 50 >50  Elbow    7.0  6.4         Left Ulnar Motor (Abd Dig Min)  30.4C  Wrist    2.7 <4.2 10.7 >3 B Elbow Wrist 3.4 17.5 *51 >53  B Elbow    6.1  10.4  A Elbow B Elbow 1.5 10.0 67 >53  A Elbow    7.6  10.5           EMG   Side Muscle Nerve Root Ins Act Fibs Psw Amp Dur Poly Recrt Int Dennie Bible Comment  Left Abd Poll Brev Median C8-T1 Nml Nml Nml Nml Nml 0 Nml Nml   Left 1stDorInt Ulnar C8-T1 Nml Nml Nml Nml Nml 0 Nml Nml   Left PronatorTeres Median C6-7 Nml Nml Nml Nml Nml 0 Nml Nml   Left Biceps Musculocut C5-6 Nml Nml Nml Nml Nml 0 Nml Nml   Left Deltoid Axillary C5-6 Nml Nml Nml Nml Nml 0 Nml Nml     Nerve Conduction Studies Anti Sensory Left/Right Comparison   Stim Site L Lat (ms) R Lat (ms) L-R Lat (ms) L Amp (V) R Amp (V) L-R Amp (%) Site1 Site2 L Vel (m/s) R Vel (m/s) L-R Vel (m/s)  Median Acr Palm Anti Sensory (2nd Digit)  29.9C  Wrist 3.5   33.2   Wrist Palm     Palm 1.6   50.1         Radial Anti Sensory (Base 1st Digit)  30.5C  Wrist 2.1   30.5   Wrist Base 1st Digit     Ulnar Anti Sensory (5th Digit)  30C  Wrist  3.7   37.9   Wrist 5th Digit 38     Motor Left/Right Comparison   Stim Site L Lat (ms) R Lat (ms) L-R Lat (ms) L Amp (mV) R Amp (mV) L-R Amp (%) Site1 Site2 L Vel (m/s) R Vel (m/s) L-R Vel (m/s)  Median Motor (Abd Poll Brev)  30.6C  Wrist 3.3   10.1   Elbow Wrist 50    Elbow 7.0   6.4         Ulnar Motor (Abd Dig Min)  30.4C  Wrist 2.7   10.7   B Elbow Wrist *51    B Elbow 6.1   10.4   A Elbow B Elbow 67    A Elbow 7.6   10.5            Waveforms:

## 2020-08-19 ENCOUNTER — Inpatient Hospital Stay: Admission: RE | Admit: 2020-08-19 | Payer: 59 | Source: Ambulatory Visit

## 2020-08-21 ENCOUNTER — Ambulatory Visit: Payer: 59 | Admitting: Orthopaedic Surgery

## 2020-08-29 ENCOUNTER — Other Ambulatory Visit: Payer: Self-pay | Admitting: Obstetrics and Gynecology

## 2020-08-29 ENCOUNTER — Other Ambulatory Visit (HOSPITAL_COMMUNITY)
Admission: RE | Admit: 2020-08-29 | Discharge: 2020-08-29 | Disposition: A | Discharge status: Home / Self Care | Payer: 59 | Source: Ambulatory Visit | Attending: Internal Medicine | Admitting: Internal Medicine

## 2020-08-29 ENCOUNTER — Other Ambulatory Visit: Payer: Self-pay

## 2020-08-29 DIAGNOSIS — Z20822 Contact with and (suspected) exposure to covid-19: Secondary | ICD-10-CM | POA: Diagnosis not present

## 2020-08-29 DIAGNOSIS — Z01812 Encounter for preprocedural laboratory examination: Secondary | ICD-10-CM | POA: Diagnosis present

## 2020-08-29 DIAGNOSIS — R928 Other abnormal and inconclusive findings on diagnostic imaging of breast: Secondary | ICD-10-CM

## 2020-08-29 LAB — SARS CORONAVIRUS 2 (TAT 6-24 HRS): SARS Coronavirus 2: NEGATIVE

## 2020-08-31 ENCOUNTER — Encounter (HOSPITAL_COMMUNITY): Payer: Self-pay

## 2020-08-31 ENCOUNTER — Ambulatory Visit (HOSPITAL_COMMUNITY): Payer: 59 | Admitting: Anesthesiology

## 2020-08-31 ENCOUNTER — Ambulatory Visit (HOSPITAL_COMMUNITY)
Admission: RE | Admit: 2020-08-31 | Discharge: 2020-08-31 | Disposition: A | Discharge status: Home / Self Care | Payer: 59 | Attending: Internal Medicine | Admitting: Internal Medicine

## 2020-08-31 ENCOUNTER — Other Ambulatory Visit: Payer: Self-pay

## 2020-08-31 ENCOUNTER — Encounter (HOSPITAL_COMMUNITY)
Admission: RE | Disposition: A | Discharge status: Home / Self Care | Payer: Self-pay | Source: Home / Self Care | Attending: Internal Medicine

## 2020-08-31 DIAGNOSIS — Z87891 Personal history of nicotine dependence: Secondary | ICD-10-CM | POA: Insufficient documentation

## 2020-08-31 DIAGNOSIS — Z1211 Encounter for screening for malignant neoplasm of colon: Secondary | ICD-10-CM | POA: Diagnosis present

## 2020-08-31 DIAGNOSIS — K648 Other hemorrhoids: Secondary | ICD-10-CM | POA: Diagnosis not present

## 2020-08-31 HISTORY — PX: COLONOSCOPY WITH PROPOFOL: SHX5780

## 2020-08-31 SURGERY — COLONOSCOPY WITH PROPOFOL
Anesthesia: General

## 2020-08-31 MED ORDER — PROPOFOL 500 MG/50ML IV EMUL
INTRAVENOUS | Status: DC | PRN
  Administered 2020-08-31: 150 ug/kg/min via INTRAVENOUS

## 2020-08-31 MED ORDER — PROPOFOL 10 MG/ML IV BOLUS
INTRAVENOUS | Status: DC | PRN
  Administered 2020-08-31: 100 mg via INTRAVENOUS
  Administered 2020-08-31 (×2): 20 mg via INTRAVENOUS

## 2020-08-31 MED ORDER — LACTATED RINGERS IV SOLN: INTRAVENOUS | Status: DC | PRN

## 2020-08-31 MED ORDER — LINACLOTIDE 290 MCG PO CAPS: 290.0000 ug | ORAL_CAPSULE | Freq: Every day | ORAL | 1 refills | Status: DC

## 2020-08-31 MED ORDER — STERILE WATER FOR IRRIGATION IR SOLN
Status: DC | PRN
  Administered 2020-08-31: 1.5 mL

## 2020-08-31 NOTE — Discharge Instructions (Addendum)
  Colonoscopy Discharge Instructions  Read the instructions outlined below and refer to this sheet in the next few weeks. These discharge instructions provide you with general information on caring for yourself after you leave the hospital. Your doctor may also give you specific instructions. While your treatment has been planned according to the most current medical practices available, unavoidable complications occasionally occur.   ACTIVITY  You may resume your regular activity, but move at a slower pace for the next 24 hours.   Take frequent rest periods for the next 24 hours.   Walking will help get rid of the air and reduce the bloated feeling in your belly (abdomen).   No driving for 24 hours (because of the medicine (anesthesia) used during the test).    Do not sign any important legal documents or operate any machinery for 24 hours (because of the anesthesia used during the test).  NUTRITION  Drink plenty of fluids.   You may resume your normal diet as instructed by your doctor.   Begin with a light meal and progress to your normal diet. Heavy or fried foods are harder to digest and may make you feel sick to your stomach (nauseated).   Avoid alcoholic beverages for 24 hours or as instructed.  MEDICATIONS  You may resume your normal medications unless your doctor tells you otherwise.  WHAT YOU CAN EXPECT TODAY  Some feelings of bloating in the abdomen.   Passage of more gas than usual.   Spotting of blood in your stool or on the toilet paper.  IF YOU HAD POLYPS REMOVED DURING THE COLONOSCOPY:  No aspirin products for 7 days or as instructed.   No alcohol for 7 days or as instructed.   Eat a soft diet for the next 24 hours.  FINDING OUT THE RESULTS OF YOUR TEST Not all test results are available during your visit. If your test results are not back during the visit, make an appointment with your caregiver to find out the results. Do not assume everything is normal if  you have not heard from your caregiver or the medical facility. It is important for you to follow up on all of your test results.  SEEK IMMEDIATE MEDICAL ATTENTION IF:  You have more than a spotting of blood in your stool.   Your belly is swollen (abdominal distention).   You are nauseated or vomiting.   You have a temperature over 101.   You have abdominal pain or discomfort that is severe or gets worse throughout the day.   Your colonoscopy was relatively unremarkable.  I did not find any polyps or evidence of colon cancer.  Recommend repeating in 10 years for colon cancer screening purposes.  I am going to send in you a medication called Linzess 290 mcg daily for constipation.  If you have issues with insurance covering this then call our office and we will do a prior authorization to get it covered.  Otherwise follow-up in 2 months.  I hope you have a great rest of your week!  Hennie Duos. Marletta Lor, D.O. Gastroenterology and Hepatology [facility]

## 2020-08-31 NOTE — Transfer of Care (Signed)
Immediate Anesthesia Transfer of Care Note  Patient: Amber Simpson  Procedure(s) Performed: COLONOSCOPY WITH PROPOFOL (N/A )  Patient Location: PACU  Anesthesia Type:General  Level of Consciousness: drowsy  Airway & Oxygen Therapy: Patient Spontanous Breathing  Post-op Assessment: Report given to RN and Post -op Vital signs reviewed and stable  Post vital signs: Reviewed and stable  Last Vitals:  Vitals Value Taken Time  BP    Temp    Pulse    Resp    SpO2      Last Pain:  Vitals:   08/31/20 1333  TempSrc:   PainSc: 0-No pain      Patients Stated Pain Goal: 7 (08/31/20 1216)  Complications: No complications documented.

## 2020-08-31 NOTE — Anesthesia Preprocedure Evaluation (Signed)
Anesthesia Evaluation  Patient identified by MRN, date of birth, ID band Patient awake    Reviewed: Allergy & Precautions, H&P , NPO status , Patient's Chart, lab work & pertinent test results, reviewed documented beta blocker date and time   Airway Mallampati: II  TM Distance: >3 FB Neck ROM: full    Dental no notable dental hx.    Pulmonary neg pulmonary ROS, former smoker,    Pulmonary exam normal breath sounds clear to auscultation       Cardiovascular Exercise Tolerance: Good negative cardio ROS   Rhythm:regular Rate:Normal     Neuro/Psych  Headaches, negative psych ROS   GI/Hepatic negative GI ROS, Neg liver ROS,   Endo/Other  negative endocrine ROS  Renal/GU negative Renal ROS  negative genitourinary   Musculoskeletal   Abdominal   Peds  Hematology negative hematology ROS (+)   Anesthesia Other Findings   Reproductive/Obstetrics negative OB ROS                             Anesthesia Physical Anesthesia Plan  ASA: II  Anesthesia Plan: General   Post-op Pain Management:    Induction:   PONV Risk Score and Plan: Propofol infusion  Airway Management Planned:   Additional Equipment:   Intra-op Plan:   Post-operative Plan:   Informed Consent: I have reviewed the patients History and Physical, chart, labs and discussed the procedure including the risks, benefits and alternatives for the proposed anesthesia with the patient or authorized representative who has indicated his/her understanding and acceptance.     Dental Advisory Given  Plan Discussed with: CRNA  Anesthesia Plan Comments:         Anesthesia Quick Evaluation

## 2020-08-31 NOTE — Anesthesia Postprocedure Evaluation (Signed)
Anesthesia Post Note  Patient: Amber Simpson  Procedure(s) Performed: COLONOSCOPY WITH PROPOFOL (N/A )  Patient location during evaluation: Phase II Anesthesia Type: General Level of consciousness: awake Pain management: pain level controlled Vital Signs Assessment: post-procedure vital signs reviewed and stable Respiratory status: spontaneous breathing and respiratory function stable Cardiovascular status: blood pressure returned to baseline and stable Postop Assessment: no headache and no apparent nausea or vomiting Anesthetic complications: no Comments: Late entry   No complications documented.   Last Vitals:  Vitals:   08/31/20 1216 08/31/20 1347  BP: 116/61 103/83  Pulse: 68   Resp: 12 16  Temp: 37.1 C 36.4 C  SpO2: 99% 99%    Last Pain:  Vitals:   08/31/20 1347  TempSrc: Oral  PainSc: 0-No pain                 Windell Norfolk

## 2020-08-31 NOTE — Op Note (Signed)
[facility] Patient Name: Amber Simpson Procedure Date: 08/31/2020 1:23 PM MRN: [id number] Date of Birth: September 08, 1965 Attending MD: Elon Alas. Edgar Frisk CSN: [id number] Age: 55 Admit Type: Outpatient Procedure:                Colonoscopy Indications:              Screening for colorectal malignant neoplasm Providers:                Elon Alas. Abbey Chatters, DO, Charlsie Quest. Theda Sers RN, RN,                            Aram Candela Referring MD:              Medicines:                See the Anesthesia note for documentation of the                            administered medications Complications:            No immediate complications. Estimated Blood Loss:     Estimated blood loss: none. Procedure:                Pre-Anesthesia Assessment:                           - The anesthesia plan was to use monitored                            anesthesia care (MAC).                           After obtaining informed consent, the colonoscope                            was passed under direct vision. Throughout the                            procedure, the patient's blood pressure, pulse, and                            oxygen saturations were monitored continuously. The                            [id number]-HQ190L ([id number]) scope was introduced through                            the anus and advanced to the the cecum, identified                            by appendiceal orifice and ileocecal valve. The                            colonoscopy was performed without difficulty. The                            patient tolerated the procedure well. The  quality                            of the bowel preparation was evaluated using the                            BBPS [facility] Peak Bowel Preparation Scale) with scores                            of: Right Colon = 3 (entire mucosa seen well with                            no residual staining, small fragments of stool or                            opaque liquid),  Transverse Colon = 3 (entire mucosa                            seen well with no residual staining, small                            fragments of stool or opaque liquid) and Left Colon                            = 3 (entire mucosa seen well with no residual                            staining, small fragments of stool or opaque                            liquid). The total BBPS score equals 9. The quality                            of the bowel preparation was good. Scope In: 1:32:48 PM Scope Out: 1:44:17 PM Scope Withdrawal Time: 0 hours 6 minutes 12 seconds  Total Procedure Duration: 0 hours 11 minutes 29 seconds  Findings:      The perianal and digital rectal examinations were normal.      Non-bleeding internal hemorrhoids were found during endoscopy.      The exam was otherwise without abnormality. Impression:               - Non-bleeding internal hemorrhoids.                           - The examination was otherwise normal.                           - No specimens collected. Moderate Sedation:      Per Anesthesia Care Recommendation:           - Patient has a contact number available for                            emergencies. The signs and symptoms of potential  delayed complications were discussed with the                            patient. Return to normal activities tomorrow.                            Written discharge instructions were provided to the                            patient.                           - Resume previous diet.                           - Continue present medications.                           - Repeat colonoscopy in 10 years for screening                            purposes.                           - Return to GI clinic in 2 months.                           - Use Linzess (linaclotide) 290 mcg PO daily. Procedure Code(s):        --- Professional ---                           Q2060, Colorectal cancer screening;  colonoscopy on                            individual not meeting criteria for high risk Diagnosis Code(s):        --- Professional ---                           Z12.11, Encounter for screening for malignant                            neoplasm of colon                           K64.8, Other hemorrhoids CPT copyright 2019 American Medical Association. All rights reserved. The codes documented in this report are preliminary and upon coder review may  be revised to meet current compliance requirements. Elon Alas. Abbey Chatters, DO [place] Abbey Chatters, DO 08/31/2020 1:51:55 PM This report has been signed electronically. Number of Addenda: 0

## 2020-08-31 NOTE — H&P (Signed)
Primary Care Physician:  Heather Roberts, NP Primary Gastroenterologist:  Dr. Marletta Lor  Pre-Procedure History & Physical: HPI:  Amber Simpson is a 55 y.o. female is here for a colonoscopy for colon cancer screening purposes.  Patient denies any family history of colorectal cancer.  No melena or hematochezia.  No abdominal pain or unintentional weight loss.  No change in bowel habits.  Overall feels well from a GI standpoint.  Past Medical History:  Diagnosis Date  . Allergy    sutures- she states she her wounds open up around the 3rd day post-op  . Migraines   . Vertigo     Past Surgical History:  Procedure Laterality Date  . BREAST REDUCTION SURGERY Bilateral   . MOUTH SURGERY    . 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 TONSILLECTOMY      Prior to Admission medications   Medication Sig Start Date End Date Taking? Authorizing Provider  Cyanocobalamin (B-12) 5000 MCG CAPS Take 5,000 mcg by mouth daily.   Yes [provider]  glucosamine-chondroitin 500-400 MG tablet Take 1 tablet by mouth in the morning and at bedtime.   Yes [provider]  Multiple Minerals (CALCIUM-MAGNESIUM-ZINC) TABS Take 1 tablet by mouth daily.   Yes [provider]  Multiple Vitamins-Minerals (MULTIVITAMIN WITH MINERALS) tablet Take 1 tablet by mouth daily.   Yes [provider]    Allergies as of 08/07/2020 - Review Complete 08/06/2020  Allergen Reaction Noted  . Other Other (See Comments) 01/31/2020    Family History  Problem Relation Age of Onset  . Heart attack Mother   . Diabetes Father   . Cancer Father        lung cancer  . Diabetes Brother   . Kidney disease Brother     Social History   Socioeconomic History  . Marital status: Married    Spouse name: Not on file  . Number of children: Not on file  . Years of education: Not on file  . Highest education level: Not on file  Occupational History  . Occupation: [employer]-  Part time  Tobacco Use  . Smoking status: Former Smoker    Packs/day: 1.00    Years: 15.00    Pack years: 15.00    Types: Cigarettes    Quit date: 05/12/2017    Years since quitting: 3.3  . Smokeless tobacco: Never Used  Vaping Use  . Vaping Use: Never used  Substance and Sexual Activity  . Alcohol use: Yes    Comment: occasional wine or margarita; once per week  . Drug use: Not Currently  . Sexual activity: Not on file  Other Topics Concern  . Not on file  Social History Narrative  . Not on file   Social Determinants of Health   Financial Resource Strain: Not on file  Food Insecurity: Not on file  Transportation Needs: Not on file  Physical Activity: Not on file  Stress: Not on file  Social Connections: Not on file  Intimate Partner Violence: Not on file    Review of Systems: See HPI, otherwise negative ROS  Physical Exam: Vital signs in last 24 hours:     General:   Alert,  Well-developed, well-nourished, pleasant and cooperative in NAD Head:  Normocephalic and atraumatic. Eyes:  Sclera clear, no icterus.   Conjunctiva pink. Ears:  Normal auditory acuity. Nose:  No deformity, discharge,  or lesions. Mouth:  No deformity or lesions,  dentition normal. Neck:  Supple; no masses or thyromegaly. Lungs:  Clear throughout to auscultation.   No wheezes, crackles, or rhonchi. No acute distress. Heart:  Regular rate and rhythm; no murmurs, clicks, rubs,  or gallops. Abdomen:  Soft, nontender and nondistended. No masses, hepatosplenomegaly or hernias noted. Normal bowel sounds, without guarding, and without rebound.   Msk:  Symmetrical without gross deformities. Normal posture. Extremities:  Without clubbing or edema. Neurologic:  Alert and  oriented x4;  grossly normal neurologically. Skin:  Intact without significant lesions or rashes. Cervical Nodes:  No significant cervical adenopathy. Psych:  Alert and cooperative. Normal mood and  affect.  Impression/Plan: Amber Simpson is here for a colonoscopy to be performed for colon cancer screening purposes.  The risks of the procedure including infection, bleed, or perforation as well as benefits, limitations, alternatives and imponderables have been reviewed with the patient. Questions have been answered. All parties agreeable.

## 2020-09-03 DIAGNOSIS — R928 Other abnormal and inconclusive findings on diagnostic imaging of breast: Secondary | ICD-10-CM | POA: Insufficient documentation

## 2020-09-05 ENCOUNTER — Encounter (HOSPITAL_COMMUNITY): Payer: Self-pay | Admitting: Internal Medicine

## 2020-09-13 ENCOUNTER — Other Ambulatory Visit: Payer: Self-pay | Admitting: Orthopaedic Surgery

## 2020-09-13 ENCOUNTER — Telehealth: Payer: Self-pay | Admitting: Orthopaedic Surgery

## 2020-09-13 NOTE — Telephone Encounter (Signed)
Call received from Satoria at [facility], regarding scheduled MRI for 09/15/20, and it has been noted that [facility] authorization has expired. Asking if we can contact insurer for extension on the approval/authorization? States they will keep on the schedule until tomorrow, 09/14/20. Please call back to advise [phone]

## 2020-09-15 ENCOUNTER — Other Ambulatory Visit: Payer: 59

## 2020-09-19 NOTE — Telephone Encounter (Signed)
I looked at this one, and cannot do online, it won't recognize patient as currently eligible/covered.  Can you please call [employer] this afternoon and work on this one?  Thanks.

## 2020-09-21 ENCOUNTER — Other Ambulatory Visit: Payer: Self-pay | Admitting: Obstetrics and Gynecology

## 2020-09-21 ENCOUNTER — Other Ambulatory Visit: Payer: Self-pay

## 2020-09-21 ENCOUNTER — Ambulatory Visit
Admission: RE | Admit: 2020-09-21 | Discharge: 2020-09-21 | Disposition: A | Discharge status: Home / Self Care | Payer: 59 | Source: Ambulatory Visit | Attending: Obstetrics and Gynecology | Admitting: Obstetrics and Gynecology

## 2020-09-21 DIAGNOSIS — R928 Other abnormal and inconclusive findings on diagnostic imaging of breast: Secondary | ICD-10-CM

## 2020-09-21 DIAGNOSIS — N6489 Other specified disorders of breast: Secondary | ICD-10-CM

## 2020-09-24 ENCOUNTER — Ambulatory Visit: Payer: 59

## 2020-09-26 ENCOUNTER — Ambulatory Visit
Admission: RE | Admit: 2020-09-26 | Discharge: 2020-09-26 | Disposition: A | Discharge status: Home / Self Care | Payer: 59 | Source: Ambulatory Visit | Attending: Obstetrics and Gynecology | Admitting: Obstetrics and Gynecology

## 2020-09-26 ENCOUNTER — Other Ambulatory Visit: Payer: Self-pay

## 2020-09-26 DIAGNOSIS — N6489 Other specified disorders of breast: Secondary | ICD-10-CM

## 2020-10-01 ENCOUNTER — Other Ambulatory Visit: Payer: Self-pay

## 2020-10-01 ENCOUNTER — Encounter: Payer: Self-pay | Admitting: Nurse Practitioner

## 2020-10-01 ENCOUNTER — Ambulatory Visit (INDEPENDENT_AMBULATORY_CARE_PROVIDER_SITE_OTHER): Payer: 59 | Admitting: Nurse Practitioner

## 2020-10-01 VITALS — BP 100/65 | HR 74 | Temp 98.8°F | Resp 20 | Ht 64.5 in | Wt 141.0 lb

## 2020-10-01 DIAGNOSIS — Z0000 Encounter for general adult medical examination without abnormal findings: Secondary | ICD-10-CM

## 2020-10-01 DIAGNOSIS — Z0001 Encounter for general adult medical examination with abnormal findings: Secondary | ICD-10-CM | POA: Diagnosis not present

## 2020-10-01 DIAGNOSIS — G43009 Migraine without aura, not intractable, without status migrainosus: Secondary | ICD-10-CM

## 2020-10-01 DIAGNOSIS — M25572 Pain in left ankle and joints of left foot: Secondary | ICD-10-CM

## 2020-10-01 DIAGNOSIS — G8929 Other chronic pain: Secondary | ICD-10-CM | POA: Diagnosis not present

## 2020-10-01 NOTE — Patient Instructions (Signed)
Please have fasting labs drawn 2-3 days prior to your appointment so we can discuss the results during your office visit.  

## 2020-10-01 NOTE — Assessment & Plan Note (Signed)
-  had surgery in [place] and has had no pain/migraines after that -no longer using nurtec; she never started maxalt d/t concerns with side effects

## 2020-10-01 NOTE — Progress Notes (Signed)
Acute Office Visit  Subjective:    Patient ID: Amber Simpson, female    DOB: June 14, 1965, 55 y.o.   MRN: [id number]  Chief Complaint  Patient presents with  . Follow-up    HPI Patient is in today for med check. At her last visit, we started her on nurtec to prevent migraines. She stopped using maxalt over concerns for side effects. At that time, she was having 4-6 migraine days per month, and with migraines she was experiencing vomiting, photosensitivity, and left-sided head pain. She has not been using nurtec.  She states that her migraines are much better since she had surgery in [place]. She states that she had some bones manipulated in her face, and may have had some sinus issues that resolved ater this.  She is having left foot and ankle pain that has been ongoing for about a year since she has a fall while fishing.  Past Medical History:  Diagnosis Date  . Allergy    sutures- she states she her wounds open up around the 3rd day post-op  . Migraines   . Vertigo     Past Surgical History:  Procedure Laterality Date  . BREAST REDUCTION SURGERY Bilateral   . COLONOSCOPY WITH PROPOFOL N/A 08/31/2020   Procedure: COLONOSCOPY WITH PROPOFOL;  Surgeon: Eloise Harman, DO;  [facility];  Service: Endoscopy;  Laterality: N/A;  ASA I / II / PM procedure  . MOUTH SURGERY    . 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 REDUCTION MAMMAPLASTY Bilateral    Age 73  . REDUCTION MAMMAPLASTY Bilateral    Age 38  . TONSILLECTOMY      Family History  Problem Relation Age of Onset  . Heart attack Mother   . Diabetes Father   . Cancer Father        lung cancer  . Diabetes Brother   . Kidney disease Brother     Social History   Socioeconomic History  . Marital status: Married    Spouse name: Not on file  . Number of children: Not on file  . Years of education: Not on file  . Highest education level: Not on file  Occupational  History  . Occupation: [employer]- Part time  Tobacco Use  . Smoking status: Former Smoker    Packs/day: 1.00    Years: 15.00    Pack years: 15.00    Types: Cigarettes    Quit date: 05/12/2017    Years since quitting: 3.3  . Smokeless tobacco: Never Used  Vaping Use  . Vaping Use: Never used  Substance and Sexual Activity  . Alcohol use: Yes    Comment: occasional wine or margarita; once per week  . Drug use: Not Currently  . Sexual activity: Not on file  Other Topics Concern  . Not on file  Social History Narrative  . Not on file   Social Determinants of Health   Financial Resource Strain: Not on file  Food Insecurity: Not on file  Transportation Needs: Not on file  Physical Activity: Not on file  Stress: Not on file  Social Connections: Not on file  Intimate Partner Violence: Not on file    Outpatient Medications Prior to Visit  Medication Sig Dispense Refill  . Cyanocobalamin (B-12) 5000 MCG CAPS Take 5,000 mcg by mouth daily.    Marland Kitchen glucosamine-chondroitin 500-400 MG tablet Take 1 tablet by mouth in the morning and at  bedtime.    . Multiple Minerals (CALCIUM-MAGNESIUM-ZINC) TABS Take 1 tablet by mouth daily.    . Multiple Vitamins-Minerals (MULTIVITAMIN WITH MINERALS) tablet Take 1 tablet by mouth daily.    Marland Kitchen linaclotide (LINZESS) 290 MCG CAPS capsule Take 1 capsule (290 mcg total) by mouth daily before breakfast. 90 capsule 1   No facility-administered medications prior to visit.    Allergies  Allergen Reactions  . Lactose Intolerance (Gi)     Upset stomach   . Other Other (See Comments)    Suture stitching -     Review of Systems  Constitutional: Negative.   Respiratory: Negative.   Cardiovascular: Negative.   Musculoskeletal: Positive for arthralgias.       Left ankle pain that has been chronic  Psychiatric/Behavioral: Negative.        Objective:    Physical Exam Constitutional:      Appearance: Normal appearance.  Cardiovascular:     Rate and  Rhythm: Normal rate and regular rhythm.     Pulses: Normal pulses.     Heart sounds: Normal heart sounds.  Pulmonary:     Effort: Pulmonary effort is normal.     Breath sounds: Normal breath sounds.  Musculoskeletal:        General: Deformity present.     Comments: Muscular deformity near left tibia  Neurological:     Mental Status: She is alert.  Psychiatric:        Mood and Affect: Mood normal.        Behavior: Behavior normal.        Thought Content: Thought content normal.        Judgment: Judgment normal.     BP 100/65   Pulse 74   Temp 98.8 F (37.1 C)   Resp 20   Ht 5' 4.5" (1.638 m)   Wt 141 lb (64 kg)   SpO2 97%   BMI 23.83 kg/m  Wt Readings from Last 3 Encounters:  10/01/20 141 lb (64 kg)  08/06/20 143 lb 12.8 oz (65.2 kg)  07/02/20 141 lb (64 kg)    Health Maintenance Due  Topic Date Due  . MAMMOGRAM  Never done    There are no preventive care reminders to display for this patient.   Lab Results  Component Value Date   TSH 1.390 03/27/2020   Lab Results  Component Value Date   WBC 5.1 07/04/2020   HGB 13.3 07/04/2020   HCT 40.6 07/04/2020   MCV 88 07/04/2020   PLT 341 07/04/2020   Lab Results  Component Value Date   NA 141 07/04/2020   K 4.1 07/04/2020   CO2 23 07/04/2020   GLUCOSE 88 07/04/2020   BUN 15 07/04/2020   CREATININE 0.86 07/04/2020   BILITOT 0.4 07/04/2020   ALKPHOS 71 07/04/2020   AST 25 07/04/2020   ALT 9 07/04/2020   PROT 7.3 07/04/2020   ALBUMIN 4.8 07/04/2020   CALCIUM 9.6 07/04/2020   Lab Results  Component Value Date   CHOL 155 07/04/2020   Lab Results  Component Value Date   HDL 61 07/04/2020   Lab Results  Component Value Date   LDLCALC 77 07/04/2020   Lab Results  Component Value Date   TRIG 91 07/04/2020   Lab Results  Component Value Date   CHOLHDL 2.5 03/27/2020   No results found for: HGBA1C     Assessment & Plan:   Problem List Items Addressed This Visit      Cardiovascular and  Mediastinum   Migraine    -had surgery in [place] and has had no pain/migraines after that -no longer using nurtec; she never started maxalt d/t concerns with side effects        Other   Left ankle pain - Primary   Relevant Orders   Ambulatory referral to Podiatry   Ambulatory referral to Physical Therapy    Other Visit Diagnoses    Routine medical exam       Relevant Orders   CBC with Differential/Platelet   Lipid Panel With LDL/HDL Ratio   CMP14+EGFR       No orders of the defined types were placed in this encounter.    Noreene Larsson, NP

## 2020-10-04 ENCOUNTER — Other Ambulatory Visit: Payer: Self-pay

## 2020-10-04 ENCOUNTER — Encounter: Payer: Self-pay | Admitting: Orthopaedic Surgery

## 2020-10-04 ENCOUNTER — Ambulatory Visit (INDEPENDENT_AMBULATORY_CARE_PROVIDER_SITE_OTHER): Payer: 59 | Admitting: Orthopaedic Surgery

## 2020-10-04 DIAGNOSIS — M25531 Pain in right wrist: Secondary | ICD-10-CM | POA: Diagnosis not present

## 2020-10-04 NOTE — Addendum Note (Signed)
Addended by: Jodene Nam A on: 10/04/2020 02:50 PM   Modules accepted: Orders

## 2020-10-04 NOTE — Progress Notes (Signed)
Virtual Visit via Telephone Note  I connected with@ on 10/04/20 at 10:10 AM EDT by telephone and verified that I am speaking with the correct person using two identifiers.  Location: Patient: home Provider: [facility]   I discussed the limitations, risks, security and privacy concerns of performing an evaluation and management service by telephone and the availability of in person appointments. I also discussed with the patient that there may be a patient responsible charge related to this service. The patient expressed understanding and agreed to proceed.   History of Present Illness: She had EMGs which were normal. She did not return to office after this was done.  I saw her last in February of this year.  She has wrist pain on the right that is not getting better.  I had her scheduled for MRI but she did not go and the insurance authorization has expired.  I will ask for this again and she agrees to go to get MRI this time.  She has no new trauma.  I will need to see her after she gets the MRI.   Observations/Objective: Per above.  Assessment and Plan: Encounter Diagnosis  Name Primary?  . Pain in right wrist Yes     Follow Up Instructions: Get MRI, I am concerned about ligamentous tear.  Come to office in three weeks.   I discussed the assessment and treatment plan with the patient. The patient was provided an opportunity to ask questions and all were answered. The patient agreed with the plan and demonstrated an understanding of the instructions.   The patient was advised to call back or seek an in-person evaluation if the symptoms worsen or if the condition fails to improve as anticipated.  I provided 8 minutes of non-face-to-face time during this encounter.   Darreld Mclean, MD

## 2020-10-09 ENCOUNTER — Ambulatory Visit (INDEPENDENT_AMBULATORY_CARE_PROVIDER_SITE_OTHER): Payer: 59 | Admitting: Podiatry

## 2020-10-09 ENCOUNTER — Other Ambulatory Visit: Payer: Self-pay

## 2020-10-09 ENCOUNTER — Ambulatory Visit (INDEPENDENT_AMBULATORY_CARE_PROVIDER_SITE_OTHER): Payer: 59

## 2020-10-09 DIAGNOSIS — G8929 Other chronic pain: Secondary | ICD-10-CM | POA: Diagnosis not present

## 2020-10-09 DIAGNOSIS — M25372 Other instability, left ankle: Secondary | ICD-10-CM

## 2020-10-09 DIAGNOSIS — M25572 Pain in left ankle and joints of left foot: Secondary | ICD-10-CM | POA: Diagnosis not present

## 2020-10-09 DIAGNOSIS — M7751 Other enthesopathy of right foot: Secondary | ICD-10-CM | POA: Diagnosis not present

## 2020-10-09 NOTE — Progress Notes (Signed)
  Subjective:  Patient ID: Amber Simpson, female    DOB: 1965/09/26,  MRN: [id number]  Chief Complaint  Patient presents with  . Ankle Pain    Left ankle pain x 1.5 years. Pt states pain is off and on. Pain located at medial and lateral side of ankle. Per pt she has rolled her ankle a few times in the past.     55 y.o. female presents with the above complaint. History confirmed with patient.   Objective:  Physical Exam: warm, good capillary refill, no trophic changes or ulcerative lesions, normal DP and PT pulses and normal sensory exam. Left Foot: POP left ATFL, no pain at CFL, no pain at peroneals or STJ. +Anterior drawer, negative talar tilt.    No images are attached to the encounter.  Radiographs: X-ray of the left ankle: no fracture, dislocation, swelling or degenerative changes noted Assessment:   1. Ankle instability, left   2. Chronic pain of left ankle    Plan:  Patient was evaluated and treated and all questions answered.  Ankle instability left -XR taken and reviewed with patient -Educated on etiology of injury -ASO Brace dispensed -Consider MRI if pain persists to eval ligament; may benefit from ligament repair/augmentation  Return in about 4 weeks (around 11/06/2020) for ankle instability f/u.

## 2020-10-11 ENCOUNTER — Other Ambulatory Visit (HOSPITAL_COMMUNITY): Payer: Self-pay | Admitting: General Surgery

## 2020-10-11 ENCOUNTER — Ambulatory Visit (INDEPENDENT_AMBULATORY_CARE_PROVIDER_SITE_OTHER): Payer: 59 | Admitting: General Surgery

## 2020-10-11 ENCOUNTER — Other Ambulatory Visit: Payer: Self-pay

## 2020-10-11 ENCOUNTER — Encounter: Payer: Self-pay | Admitting: General Surgery

## 2020-10-11 VITALS — BP 106/74 | HR 77 | Temp 98.5°F | Resp 16 | Ht 64.5 in | Wt 142.0 lb

## 2020-10-11 DIAGNOSIS — N6489 Other specified disorders of breast: Secondary | ICD-10-CM | POA: Diagnosis not present

## 2020-10-11 HISTORY — DX: Other specified disorders of breast: N64.89

## 2020-10-11 NOTE — Progress Notes (Signed)
[facility] History and Physical  Reason for Referral: Complex Sclerosing lesion left breast  Referring Physician:  Breast center   Chief Complaint    New Patient (Initial Visit)      Amber Simpson is a 55 y.o. female.  HPI: Amber Simpson is a very sweet 55 yo who has had breast reduction X 2 and prior cyst removed in the past Amber Simpson reports. 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   The patient has no history of any masses, lumps, bumps, nipple changes or discharge aside from some discharge with breastfeeding around her areola.   Amber Simpson has no history of any family breast cancer.  Amber Simpson has never had any previous biopsies or concerning areas on mammogram but did have a cyst removed with one of her reductions per report.   Amber Simpson has not had any chest radiation.   Past Medical History:  Diagnosis Date  . Allergy    sutures- Amber Simpson states Amber Simpson her wounds open up around the 3rd day post-op  . Migraines   . Vertigo     Past Surgical History:  Procedure Laterality Date  . BREAST REDUCTION SURGERY Bilateral   . COLONOSCOPY WITH PROPOFOL N/A 08/31/2020   Procedure: COLONOSCOPY WITH PROPOFOL;  Surgeon: Lanelle Bal, DO;  [facility];  Service: Endoscopy;  Laterality: N/A;  ASA I / II / PM procedure  . MOUTH SURGERY    . 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 REDUCTION MAMMAPLASTY Bilateral    Age 33  . REDUCTION MAMMAPLASTY Bilateral    Age 59  . TONSILLECTOMY      Family History  Problem Relation Age of Onset  . Heart attack Mother   . Diabetes Father   . Cancer Father        lung cancer  . Diabetes Brother   . Kidney disease Brother     Social History   Tobacco Use  . Smoking status: Former Smoker    Packs/day: 1.00    Years: 15.00    Pack years: 15.00    Types: Cigarettes    Quit date: 05/12/2017    Years since quitting: 3.4  . Smokeless tobacco: Never  Used  Vaping Use  . Vaping Use: Never used  Substance Use Topics  . Alcohol use: Yes    Comment: occasional wine or margarita; once per week  . Drug use: Not Currently    Medications: I have reviewed the patient's current medications. Allergies as of 10/11/2020      Reactions   Lactose Intolerance (gi)    Upset stomach    Other Other (See Comments)   Suture stitching -       Medication List       Accurate as of October 11, 2020  2:04 PM. If you have any questions, ask your nurse or doctor.        B-12 5000 MCG Caps Take 5,000 mcg by mouth daily.   Calcium-Magnesium-Zinc Tabs Take 1 tablet by mouth daily.   glucosamine-chondroitin 500-400 MG tablet Take 1 tablet by mouth in the morning and at bedtime.   multivitamin with minerals tablet Take 1 tablet by mouth daily.        ROS:  A comprehensive review of systems was negative except for: Integument/breast: positive for left breast lesion Musculoskeletal: positive for bone pain, neck pain and joint pain  Blood pressure 106/74, pulse 77, temperature 98.5 F (36.9 C), temperature source Other (Comment), resp. rate 16, height 5' 4.5" (1.638 m), weight 142 lb (64.4 kg), SpO2 97 %. Physical Exam Vitals reviewed.  Constitutional:      Appearance: Normal appearance.  HENT:     Head: Normocephalic.     Nose: Nose normal.     Mouth/Throat:     Mouth: Mucous membranes are moist.  Eyes:     Extraocular Movements: Extraocular movements intact.     Pupils: Pupils are equal, round, and reactive to light.  Cardiovascular:     Rate and Rhythm: Normal rate and regular rhythm.  Pulmonary:     Effort: Pulmonary effort is normal.     Breath sounds: Normal breath sounds.  Chest:  Breasts:     Right: No mass, nipple discharge, skin change or tenderness.     Left: No mass, nipple discharge, skin change or tenderness.    Abdominal:     General: There is no distension.     Palpations: Abdomen is soft.     Tenderness: There is  no abdominal tenderness.  Musculoskeletal:        General: Normal range of motion.     Cervical back: Normal range of motion.  Skin:    General: Skin is warm.  Neurological:     General: No focal deficit present.     Mental Status: Amber Simpson is alert and oriented to person, place, and time.  Psychiatric:        Mood and Affect: Mood normal.        Behavior: Behavior normal.        Thought Content: Thought content normal.        Judgment: Judgment normal.     Results: CLINICAL DATA:  Evaluate COIL clip placement following 3D/stereotactic guided biopsy of possible UPPER INNER LEFT breast subtle distortion.  EXAM: DIAGNOSTIC LEFT MAMMOGRAM POST STEREOTACTIC BIOPSY  COMPARISON:  Previous exam(s).  FINDINGS: Mammographic images were obtained following 3D/stereotactic guided biopsy of possible subtle distortion within the UPPER INNER LEFT breast. The COIL biopsy marking clip is in expected position at the site of biopsy.  IMPRESSION: Appropriate positioning of the COIL shaped biopsy marking clip at the site of biopsy in the UPPER INNER LEFT breast.  Final Assessment: Post Procedure Mammograms for Marker Placement   Electronically Signed   By: Harmon Pier M.D.   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 This was found to be concordant by Dr. Laveda Abbe, with excision recommended.  Pathology results were discussed with the patient by telephone. The patient reported doing well after the biopsy with tenderness at the site. Post biopsy instructions and care were reviewed and questions were answered. The patient was encouraged to call [facility] for any additional concerns.  Per patient request, surgical consultation has been arranged with Dr. Larae Grooms at [facility] on October 11, 2020.  Pathology results reported by Collene Mares RN on 09/27/2020.   Electronically Signed   By: Harmon Pier M.D.   On: 09/27/2020 15:14  CLINICAL DATA:  55 year old female for tissue sampling of UPPER INNER LEFT breast possible subtle distortion.  EXAM: LEFT BREAST STEREOTACTIC CORE NEEDLE BIOPSY  COMPARISON:  Previous exams.  FINDINGS: The patient and I discussed the procedure of stereotactic-guided biopsy including benefits and alternatives. We discussed  the high likelihood of a successful procedure. We discussed the risks of the procedure including infection, bleeding, tissue injury, clip migration, and inadequate sampling. Informed written consent was given. The usual time out protocol was performed immediately prior to the procedure.  Using sterile technique and 1% Lidocaine with and without epinephrine as local anesthetic, under stereotactic guidance, a 9 gauge vacuum assisted device was used to perform core needle biopsy of UPPER INNER LEFT breast possible subtle distortion using a SUPERIOR approach. Specimen radiograph was performed.  Lesion quadrant: UPPER INNER LEFT breast  At the conclusion of the procedure, a COIL tissue marker clip was deployed into the biopsy cavity. Follow-up 2-view mammogram was performed and dictated separately.  IMPRESSION: Stereotactic-guided biopsy of UPPER INNER LEFT breast distortion. No apparent complications.  Electronically Signed: By: Harmon Pier M.D. On: 09/26/2020 11:20  CLINICAL DATA:  55 year old female for further evaluation of possible LEFT breast asymmetry on new baseline screening mammogram. History of breast reductions.  EXAM: DIGITAL DIAGNOSTIC UNILATERAL LEFT MAMMOGRAM WITH TOMOSYNTHESIS AND CAD; ULTRASOUND LEFT BREAST LIMITED  TECHNIQUE: Left digital diagnostic mammography and breast tomosynthesis was performed. The images were evaluated with computer-aided  detection.; Targeted ultrasound examination of the left breast was performed  COMPARISON:  08/28/2020 new baseline mammogram. Patient is unable to obtain any prior [place] mammograms.  ACR Breast Density Category b: There are scattered areas of fibroglandular density.  FINDINGS: 2D/3D full field and spot compression views of the LEFT breast demonstrate a persistent focal asymmetry with possible subtle distortion within the UPPER INNER LEFT breast.  Targeted ultrasound is performed, showing no definite sonographic correlate to the mammographic abnormality.  No abnormal LEFT axillary lymph nodes are noted.  IMPRESSION: 1. UPPER INNER LEFT breast focal asymmetry/possible subtle distortion without sonographic correlate. Tissue sampling is recommended. 2. No abnormal appearing LEFT axillary lymph nodes.  RECOMMENDATION: 3D/stereotactic guided LEFT breast biopsy, which will be scheduled.  I have discussed the findings and recommendations with the patient. If applicable, a reminder letter will be sent to the patient regarding the next appointment.  BI-RADS CATEGORY  4: Suspicious.   Electronically Signed   By: Harmon Pier M.D.   On: 09/21/2020 11:18  Assessment & Plan:  Amber Simpson is a 55 y.o. female with a complex sclerosing lesion of the left breast. Discussed that this is benign but can be associated with cancers and need more breast tissue to be excised to ensure no cancer. Discussed risk of bleeding, infection, needing more surgery.   Amber Simpson reports having reactions to sutures that were under her skin in the past but not to the sutures that were around her nipple for the reduction that had to be removed. Will need to likely just do nylon on the breast to ensure no reactions.    All questions were answered to the satisfaction of the patient.    Amber Simpson 10/11/2020, 2:04 PM

## 2020-10-11 NOTE — Patient Instructions (Signed)
Breast Biopsy A breast biopsy is a procedure in which a sample of breast tissue is removed from the breast and examined under a microscope to see if cancerous cells are present. You may need a breast biopsy if you have:  Any undiagnosed breast mass (tumor).  Nipple abnormalities, dimpling, crusting, or ulcerations.  Abnormal discharge from the nipple, especially blood.  Redness, swelling, and pain of the breast.  Calcium deposits (calcifications) or abnormalities seen on a mammogram, ultrasound results, or MRI results.  Abnormal changes in the breast seen on your mammogram. If the breast abnormality is found to be cancerous (malignant), a breast biopsy can help to determine what the best treatment is for you. There are many different types of breast biopsies. Talk with your health care provider about your options and which type is best for you. Tell a health care provider about:  Any allergies you have.  All medicines you are taking, including vitamins, herbs, eye drops, creams, and over-the-counter medicines.  Any problems you or family members have had with anesthetic medicines.  Any blood disorders you have.  Any surgeries you have had.  Any medical conditions you have.  Whether you are pregnant or may be pregnant. What are the risks? Generally, this is a safe procedure. However, problems may occur, including:  Discomfort. This is temporary.  Bruising and swelling of the breast.  Changes in the shape of the breast.  Bleeding.  Infection.  Damage to other tissues.  Allergic reactions to medicines.  Needing more surgery. What happens before the procedure? Medicines Ask your health care provider about:  Changing or stopping your regular medicines. This is especially important if you are taking diabetes medicines or blood thinners.  Taking medicines such as aspirin and ibuprofen. These medicines can thin your blood. Do not take these medicines unless your health  care provider tells you to take them.  Taking over-the-counter medicines, vitamins, herbs, and supplements. Lifestyle  Do not use any products that contain nicotine or tobacco, such as cigarettes, e-cigarettes, and chewing tobacco. If you need help quitting, ask your health care provider.  Do not drink alcohol for 24 hours before the procedure.  Wear a good support bra to the procedure. Eating and drinking restrictions Talk to your health care provider about when you should stop eating and drinking.  You may be asked not to drink or eat for 2-8 hours before the breast biopsy.  In some cases, you may be allowed to eat a light breakfast. General instructions  Plan to have someone take you home from the hospital or clinic.  Ask your health care provider how your surgical site will be marked or identified.  Ask your health care provider what steps will be taken to help prevent infection. These may include: ? Removing hair at the surgery site. ? Washing skin with a germ-killing soap.  Your health care provider may do a procedure to locate and mark the tumor area in your breast (localization). This will help guide your surgeon to where the biopsy or incision is made. This may be done with: ? Imaging, such as a mammogram, ultrasound, or MRI. ? Insertion of special wire, clip, seed, or radar reflector implant in the tumor area. What happens during the procedure?  You may be given one or more of the following: ? A medicine to numb the breast area (local anesthetic). ? A medicine to help you relax (sedative). ? A medicine to make you fall asleep (general anesthetic).  Your health   care provider will perform the biopsy using only one of the following methods. He or she will do: ? Fine needle aspiration. A thin needle with a syringe will be inserted into a breast cyst. Fluid and cells will be removed. ? Core needle biopsy. A wide, hollow needle (core needle) will be inserted into a breast  lump multiple times to remove tissue samples or cores. ? Stereotactic biopsy. X-rays and a computer will be used to locate the breast lump. The surgeon will use the X-ray images to collect several samples of tissue using a needle. ? Vacuum-assisted biopsy. A small incision will be made in your breast. A hollow needle and vacuum will be passed through the incision and into the breast tissue. The vacuum will gently draw abnormal breast tissue into the needle to remove it. ? Ultrasound-guided core needle biopsy. An ultrasound will be used to help guide the core needle to the area of the mass or abnormality. An incision will be made to insert the needle. Then tissue samples will be removed. ? Surgical biopsy. An incision will be made in the breast to remove part or all of the abnormal tissue. After the tissue is removed, the skin over the area will be closed with sutures and covered with a dressing. There are two types of surgical biopsies:  Incisional biopsy. The surgeon will remove part of the breast lump.  Excisional biopsy. The surgeon will attempt to remove the whole breast lump or as much of it as possible. After any of these procedures, the tissue or fluid that was removed will be examined under a microscope. The procedure may vary among health care providers and hospitals.   What happens after the procedure?  You will be taken to the recovery area. If you are doing well and have no problems, you will be allowed to go home.  You may have a pressure dressing applied on your breast for 24-48 hours. You may also be advised to wear a supportive bra during this time.  Do not drive for 24 hours if you were given a sedative during your procedure. Summary  A breast biopsy is a procedure in which a sample of breast tissue is removed from the breast and examined under a microscope to see if cancerous cells are present.  This is a safe procedure, but problems can occur, including bleeding, infection,  pain, and bruising.  Ask your health care provider about changing or stopping your regular medicines.  Plan to have someone take you home from the hospital or clinic. This information is not intended to replace advice given to you by your health care provider. Make sure you discuss any questions you have with your health care provider. Document Revised: 01/09/2020 Document Reviewed: 10/14/2017 Elsevier Patient Education  2021 [employer].  

## 2020-10-12 NOTE — Telephone Encounter (Signed)
Patient returned Amber Simpson call from yesterday, 10/11/20; Amber Simpson is not in clinic today. Please call back to patient - states Amber Simpson will have her phone close to her Monday.

## 2020-10-15 ENCOUNTER — Ambulatory Visit (HOSPITAL_COMMUNITY): Payer: 59 | Admitting: Physical Therapy

## 2020-10-15 NOTE — Telephone Encounter (Signed)
Patient doesn't need to speak with me. She needs to be scheduled to come in and see Dr. Hilda Lias if she wants to get her MRI approved. Please schedule her.  Thanks

## 2020-10-15 NOTE — H&P (Signed)
[facility] History and Physical  Reason for Referral: Complex Sclerosing lesion left breast  Referring Physician:  Breast center   Chief Complaint    New Patient (Initial Visit)      Amber Simpson is a 55 y.o. female.  HPI: Amber Simpson is a very sweet 55 yo who has had breast reduction X 2 and prior cyst removed in the past she reports. 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   The patient has no history of any masses, lumps, bumps, nipple changes or discharge aside from some discharge with breastfeeding around her areola.   She has no history of any family breast cancer.  She has never had any previous biopsies or concerning areas on mammogram but did have a cyst removed with one of her reductions per report.   She has not had any chest radiation.   Past Medical History:  Diagnosis Date  . Allergy    sutures- she states she her wounds open up around the 3rd day post-op  . Migraines   . Vertigo     Past Surgical History:  Procedure Laterality Date  . BREAST REDUCTION SURGERY Bilateral   . COLONOSCOPY WITH PROPOFOL N/A 08/31/2020   Procedure: COLONOSCOPY WITH PROPOFOL;  Surgeon: Lanelle Bal, DO;  Location: [facility];  Service: Endoscopy;  Laterality: N/A;  ASA I / II / PM procedure  . MOUTH SURGERY    . 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 REDUCTION MAMMAPLASTY Bilateral    Age 33  . REDUCTION MAMMAPLASTY Bilateral    Age 59  . TONSILLECTOMY      Family History  Problem Relation Age of Onset  . Heart attack Mother   . Diabetes Father   . Cancer Father        lung cancer  . Diabetes Brother   . Kidney disease Brother     Social History   Tobacco Use  . Smoking status: Former Smoker    Packs/day: 1.00    Years: 15.00    Pack years: 15.00    Types: Cigarettes    Quit date: 05/12/2017    Years since quitting: 3.4  . Smokeless tobacco: Never  Used  Vaping Use  . Vaping Use: Never used  Substance Use Topics  . Alcohol use: Yes    Comment: occasional wine or margarita; once per week  . Drug use: Not Currently    Medications: I have reviewed the patient's current medications. Allergies as of 10/11/2020      Reactions   Lactose Intolerance (gi)    Upset stomach    Other Other (See Comments)   Suture stitching -       Medication List       Accurate as of October 11, 2020  2:04 PM. If you have any questions, ask your nurse or doctor.        B-12 5000 MCG Caps Take 5,000 mcg by mouth daily.   Calcium-Magnesium-Zinc Tabs Take 1 tablet by mouth daily.   glucosamine-chondroitin 500-400 MG tablet Take 1 tablet by mouth in the morning and at bedtime.   multivitamin with minerals tablet Take 1 tablet by mouth daily.        ROS:  A comprehensive review of systems was negative except for: Integument/breast: positive for left breast lesion Musculoskeletal: positive for bone pain, neck pain and joint pain  Blood pressure 106/74, pulse 77, temperature 98.5 F (36.9 C), temperature source Other (Comment), resp. rate 16, height 5' 4.5" (1.638 m), weight 142 lb (64.4 kg), SpO2 97 %. Physical Exam Vitals reviewed.  Constitutional:      Appearance: Normal appearance.  HENT:     Head: Normocephalic.     Nose: Nose normal.     Mouth/Throat:     Mouth: Mucous membranes are moist.  Eyes:     Extraocular Movements: Extraocular movements intact.     Pupils: Pupils are equal, round, and reactive to light.  Cardiovascular:     Rate and Rhythm: Normal rate and regular rhythm.  Pulmonary:     Effort: Pulmonary effort is normal.     Breath sounds: Normal breath sounds.  Chest:  Breasts:     Right: No mass, nipple discharge, skin change or tenderness.     Left: No mass, nipple discharge, skin change or tenderness.    Abdominal:     General: There is no distension.     Palpations: Abdomen is soft.     Tenderness: There is  no abdominal tenderness.  Musculoskeletal:        General: Normal range of motion.     Cervical back: Normal range of motion.  Skin:    General: Skin is warm.  Neurological:     General: No focal deficit present.     Mental Status: She is alert and oriented to person, place, and time.  Psychiatric:        Mood and Affect: Mood normal.        Behavior: Behavior normal.        Thought Content: Thought content normal.        Judgment: Judgment normal.     Results: CLINICAL DATA:  Evaluate COIL clip placement following 3D/stereotactic guided biopsy of possible UPPER INNER LEFT breast subtle distortion.  EXAM: DIAGNOSTIC LEFT MAMMOGRAM POST STEREOTACTIC BIOPSY  COMPARISON:  Previous exam(s).  FINDINGS: Mammographic images were obtained following 3D/stereotactic guided biopsy of possible subtle distortion within the UPPER INNER LEFT breast. The COIL biopsy marking clip is in expected position at the site of biopsy.  IMPRESSION: Appropriate positioning of the COIL shaped biopsy marking clip at the site of biopsy in the UPPER INNER LEFT breast.  Final Assessment: Post Procedure Mammograms for Marker Placement   Electronically Signed   By: Harmon Pier M.D.   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 This was found to be concordant by Dr. Laveda Abbe, with excision recommended.  Pathology results were discussed with the patient by telephone. The patient reported doing well after the biopsy with tenderness at the site. Post biopsy instructions and care were reviewed and questions were answered. The patient was encouraged to call [facility] for any additional concerns.  Per patient request, surgical consultation has been arranged with Dr. Larae Grooms at [facility] on October 11, 2020.  Pathology results reported by Collene Mares RN on 09/27/2020.   Electronically Signed   By: Harmon Pier M.D.   On: 09/27/2020 15:14  CLINICAL DATA:  55 year old female for tissue sampling of UPPER INNER LEFT breast possible subtle distortion.  EXAM: LEFT BREAST STEREOTACTIC CORE NEEDLE BIOPSY  COMPARISON:  Previous exams.  FINDINGS: The patient and I discussed the procedure of stereotactic-guided biopsy including benefits and alternatives. We discussed  the high likelihood of a successful procedure. We discussed the risks of the procedure including infection, bleeding, tissue injury, clip migration, and inadequate sampling. Informed written consent was given. The usual time out protocol was performed immediately prior to the procedure.  Using sterile technique and 1% Lidocaine with and without epinephrine as local anesthetic, under stereotactic guidance, a 9 gauge vacuum assisted device was used to perform core needle biopsy of UPPER INNER LEFT breast possible subtle distortion using a SUPERIOR approach. Specimen radiograph was performed.  Lesion quadrant: UPPER INNER LEFT breast  At the conclusion of the procedure, a COIL tissue marker clip was deployed into the biopsy cavity. Follow-up 2-view mammogram was performed and dictated separately.  IMPRESSION: Stereotactic-guided biopsy of UPPER INNER LEFT breast distortion. No apparent complications.  Electronically Signed: By: Harmon Pier M.D. On: 09/26/2020 11:20  CLINICAL DATA:  56 year old female for further evaluation of possible LEFT breast asymmetry on new baseline screening mammogram. History of breast reductions.  EXAM: DIGITAL DIAGNOSTIC UNILATERAL LEFT MAMMOGRAM WITH TOMOSYNTHESIS AND CAD; ULTRASOUND LEFT BREAST LIMITED  TECHNIQUE: Left digital diagnostic mammography and breast tomosynthesis was performed. The images were evaluated with computer-aided  detection.; Targeted ultrasound examination of the left breast was performed  COMPARISON:  08/28/2020 new baseline mammogram. Patient is unable to obtain any prior [place] mammograms.  ACR Breast Density Category b: There are scattered areas of fibroglandular density.  FINDINGS: 2D/3D full field and spot compression views of the LEFT breast demonstrate a persistent focal asymmetry with possible subtle distortion within the UPPER INNER LEFT breast.  Targeted ultrasound is performed, showing no definite sonographic correlate to the mammographic abnormality.  No abnormal LEFT axillary lymph nodes are noted.  IMPRESSION: 1. UPPER INNER LEFT breast focal asymmetry/possible subtle distortion without sonographic correlate. Tissue sampling is recommended. 2. No abnormal appearing LEFT axillary lymph nodes.  RECOMMENDATION: 3D/stereotactic guided LEFT breast biopsy, which will be scheduled.  I have discussed the findings and recommendations with the patient. If applicable, a reminder letter will be sent to the patient regarding the next appointment.  BI-RADS CATEGORY  4: Suspicious.   Electronically Signed   By: Harmon Pier M.D.   On: 09/21/2020 11:18  Assessment & Plan:  Amber Simpson is a 55 y.o. female with a complex sclerosing lesion of the left breast. Discussed that this is benign but can be associated with cancers and need more breast tissue to be excised to ensure no cancer. Discussed risk of bleeding, infection, needing more surgery.    Excisional biopsy after tag placement.   She reports having reactions to sutures that were under her skin in the past but not to the sutures that were around her nipple for the reduction that had to be removed. Will need to likely just do nylon on the breast to ensure no reactions.    All questions were answered to the satisfaction of the patient.    Amber Simpson 10/11/2020, 2:04 PM

## 2020-10-16 ENCOUNTER — Ambulatory Visit (INDEPENDENT_AMBULATORY_CARE_PROVIDER_SITE_OTHER): Payer: 59 | Admitting: Orthopaedic Surgery

## 2020-10-16 ENCOUNTER — Other Ambulatory Visit: Payer: Self-pay

## 2020-10-16 ENCOUNTER — Encounter: Payer: Self-pay | Admitting: Orthopaedic Surgery

## 2020-10-16 ENCOUNTER — Telehealth: Payer: Self-pay | Admitting: Orthopaedic Surgery

## 2020-10-16 VITALS — BP 115/82 | HR 67 | Ht 64.5 in | Wt 142.0 lb

## 2020-10-16 DIAGNOSIS — M79644 Pain in right finger(s): Secondary | ICD-10-CM | POA: Diagnosis not present

## 2020-10-16 DIAGNOSIS — G8929 Other chronic pain: Secondary | ICD-10-CM | POA: Diagnosis not present

## 2020-10-16 MED ORDER — PREDNISONE 5 MG (21) PO TBPK: ORAL_TABLET | ORAL | 0 refills | Status: DC

## 2020-10-16 NOTE — Progress Notes (Signed)
My right thumb hurts.  She has pain with the right thumb in flexion.  It does not lock but it hurts from the thenar eminence to the volar radial side of the hand.  She has no swelling.  It hurts to lift a heavy object.  She previously had wrist pain but that has resolved.  It is now motion of the thumb against pressure that hurts.  She has no redness.  She has no numbness.  ROM of the right thumb is full but she has pain against flexion resistance.  NV intact.  Encounter Diagnosis  Name Primary?  . Chronic pain of right thumb Yes   I am concerned about tendinitis of the flexor to the right dominant thumb.  I will give prednisone dose pack.  I will give cock-up splint with thumb guard.  I will see her in two weeks.  Call if any problem.  Precautions discussed.   Electronically Signed Darreld Mclean, MD 6/7/20223:17 PM

## 2020-10-16 NOTE — Telephone Encounter (Signed)
-----   Message from Darreld Mclean, MD sent at 10/10/2020  9:49 PM EDT ----- Regarding: RE: MRI Denial yes ----- Message ----- From: Rickard Rhymes, CMA Sent: 10/10/2020   3:46 PM EDT To: Darreld Mclean, MD Subject: MRI Denial                                     Dr. [name],  MRI was denied because she has not had an in office visit in the last 3 months. Do you want [place] to move up her appointment with you and then resubmit for MRI?   Please advise

## 2020-10-16 NOTE — Telephone Encounter (Signed)
I have made a follow up for the patient. No other concerns.

## 2020-10-22 NOTE — Telephone Encounter (Signed)
Done

## 2020-10-30 ENCOUNTER — Other Ambulatory Visit: Payer: Self-pay

## 2020-10-30 ENCOUNTER — Ambulatory Visit (INDEPENDENT_AMBULATORY_CARE_PROVIDER_SITE_OTHER): Payer: 59 | Admitting: Orthopaedic Surgery

## 2020-10-30 ENCOUNTER — Telehealth: Payer: Self-pay | Admitting: Orthopaedic Surgery

## 2020-10-30 ENCOUNTER — Other Ambulatory Visit: Payer: Self-pay | Admitting: Orthopaedic Surgery

## 2020-10-30 ENCOUNTER — Encounter: Payer: Self-pay | Admitting: Orthopaedic Surgery

## 2020-10-30 VITALS — BP 126/75 | HR 64 | Ht 64.5 in | Wt 142.8 lb

## 2020-10-30 DIAGNOSIS — G8929 Other chronic pain: Secondary | ICD-10-CM | POA: Diagnosis not present

## 2020-10-30 DIAGNOSIS — M25531 Pain in right wrist: Secondary | ICD-10-CM

## 2020-10-30 DIAGNOSIS — M79644 Pain in right finger(s): Secondary | ICD-10-CM | POA: Diagnosis not present

## 2020-10-30 NOTE — Telephone Encounter (Signed)
Patient called about her MRI please call her back [phone]

## 2020-10-30 NOTE — Progress Notes (Signed)
My thumb is better but my wrist hurts now.  She had good relief from the prednisone dose pack and the cock-up splint.  The thumb has no pain now.  She complains of dorsal pain of the right wrist, more midline.  She has no swelling, no redness.  NV intact.  She has full ROM but just slight tenderness.  Grips are good.  Encounter Diagnoses  Name Primary?   Pain in right wrist Yes   Chronic pain of right thumb    She cannot take NSAIDs.  I will have her begin Aspercreme, Biofreeze or Voltaren Gel to the wrist.  Continue the splint.  Return in three weeks.  Call if any problem.  Precautions discussed.  Electronically Signed Darreld Mclean, MD 6/21/20228:59 AM

## 2020-11-01 NOTE — Telephone Encounter (Signed)
I did call patient back at the end of clinic. She stated she had spoken with her insurance and was told that if she had PT then she would be able to get a MRI. Spoke with Dr. Hilda Lias while she was on hold and he requested to put the order in for PT.

## 2020-11-06 ENCOUNTER — Ambulatory Visit (INDEPENDENT_AMBULATORY_CARE_PROVIDER_SITE_OTHER): Payer: 59 | Admitting: Podiatry

## 2020-11-06 ENCOUNTER — Other Ambulatory Visit: Payer: Self-pay

## 2020-11-06 DIAGNOSIS — G8929 Other chronic pain: Secondary | ICD-10-CM

## 2020-11-06 DIAGNOSIS — M25372 Other instability, left ankle: Secondary | ICD-10-CM

## 2020-11-06 DIAGNOSIS — M25572 Pain in left ankle and joints of left foot: Secondary | ICD-10-CM | POA: Diagnosis not present

## 2020-11-06 NOTE — Progress Notes (Signed)
  Subjective:  Patient ID: Amber Simpson, female    DOB: 1965/07/23,  MRN: [id number]  Chief Complaint  Patient presents with   Follow-up    Reports feeling protected w/ trilock brace however some steps still cause a lot of pain.     55 y.o. female presents with the above complaint. History confirmed with patient.   Objective:  Physical Exam: warm, good capillary refill, no trophic changes or ulcerative lesions, normal DP and PT pulses and normal sensory exam. Left Foot: POP left ATFL, no pain at CFL, no pain at peroneals or STJ. +Anterior drawer, negative talar tilt.    No images are attached to the encounter.  Radiographs: 10/09/20 X-ray of the left ankle: no fracture, dislocation, swelling or degenerative changes noted Assessment:   1. Ankle instability, left   2. Chronic pain of left ankle    Plan:  Patient was evaluated and treated and all questions answered.  Ankle instability left -XR taken and reviewed with patient -Educated on etiology of injury -Re-demonstrated use of ASO including crossing straps. -Order MRI to eval ligament; may benefit from ligament repair/augmentation  No follow-ups on file.

## 2020-11-08 ENCOUNTER — Telehealth (HOSPITAL_COMMUNITY): Payer: Self-pay | Admitting: Occupational Therapy

## 2020-11-08 NOTE — Telephone Encounter (Signed)
L/m to go over insurance benefits before apptment date 11/09/20. [employer] In network (patient is in a grace period and needs to make her premium payment before 12/08/20. If nothing is paid the policy will be terminated and nothing will be covered.) L/m for pt to call us back and go over insurance benefits today 11/08/20. [employer] Plan 05/12/20-07/09/20 paid to this date--(Grace Period 09/09/20-12/09/20. S/w EilianaRef#[id number]

## 2020-11-08 NOTE — Patient Instructions (Signed)
Amber Simpson  11/08/2020     @PREFPERIOPPHARMACY @   Your procedure is scheduled on 11/14/20.  Report to 01/15/21 at [phone] A.M.  Call this number if you have problems the morning of surgery:  [phone]   Remember:  Do not eat or drink after midnight.    Take these medicines the morning of surgery with A SIP OF WATER none    Do not wear jewelry, make-up or nail polish.  Do not wear lotions, powders, or perfumes, or deodorant.  Do not shave 48 hours prior to surgery.  Men may shave face and neck.  Do not bring valuables to the hospital.  [facility] is not responsible for any belongings or valuables.  Contacts, dentures or bridgework may not be worn into surgery.  Leave your suitcase in the car.  After surgery it may be brought to your room.  For patients admitted to the hospital, discharge time will be determined by your treatment team.  Patients discharged the day of surgery will not be allowed to drive home.   Name and phone number of your driver:   family Special instructions:  n/a  Please read over the following fact sheets that you were given. Anesthesia Post-op Instructions and Care and Recovery After Surgery      Breast Biopsy A breast biopsy is a procedure in which a sample of breast tissue is removed from the breast and examined under a microscope to see if cancerous cells are present. You may need a breast biopsy if you have: Any undiagnosed breast mass (tumor). Nipple abnormalities, dimpling, crusting, or ulcerations. Abnormal discharge from the nipple, especially blood. Redness, swelling, and pain of the breast. Calcium deposits (calcifications) or abnormalities seen on a mammogram, ultrasound results, or MRI results. Abnormal changes in the breast seen on your mammogram. If the breast abnormality is found to be cancerous (malignant), a breast biopsy can help to determine what the best treatment is for you. There are many different types of breast  biopsies. Talk with your health careprovider about your options and which type is best for you. Tell a health care provider about: Any allergies you have. All medicines you are taking, including vitamins, herbs, eye drops, creams, and over-the-counter medicines. Any problems you or family members have had with anesthetic medicines. Any blood disorders you have. Any surgeries you have had. Any medical conditions you have. Whether you are pregnant or may be pregnant. What are the risks? Generally, this is a safe procedure. However, problems may occur, including: Discomfort. This is temporary. Bruising and swelling of the breast. Changes in the shape of the breast. Bleeding. Infection. Damage to other tissues. Allergic reactions to medicines. Needing more surgery. What happens before the procedure? Medicines Ask your health care provider about: Changing or stopping your regular medicines. This is especially important if you are taking diabetes medicines or blood thinners. Taking medicines such as aspirin and ibuprofen. These medicines can thin your blood. Do not take these medicines unless your health care provider tells you to take them. Taking over-the-counter medicines, vitamins, herbs, and supplements. Lifestyle Do not use any products that contain nicotine or tobacco, such as cigarettes, e-cigarettes, and chewing tobacco. If you need help quitting, ask your health care provider. Do not drink alcohol for 24 hours before the procedure. Wear a good support bra to the procedure. Eating and drinking restrictions Talk to your health care provider about when you should stop eating and drinking. You may be asked not to  drink or eat for 2-8 hours before the breast biopsy. In some cases, you may be allowed to eat a light breakfast. General instructions Plan to have someone take you home from the hospital or clinic. Ask your health care provider how your surgical site will be marked or  identified. Ask your health care provider what steps will be taken to help prevent infection. These may include: Removing hair at the surgery site. Washing skin with a germ-killing soap. Your health care provider may do a procedure to locate and mark the tumor area in your breast (localization). This will help guide your surgeon to where the biopsy or incision is made. This may be done with: Imaging, such as a mammogram, ultrasound, or MRI. Insertion of special wire, clip, seed, or radar reflector implant in the tumor area. What happens during the procedure?  You may be given one or more of the following: A medicine to numb the breast area (local anesthetic). A medicine to help you relax (sedative). A medicine to make you fall asleep (general anesthetic). Your health care provider will perform the biopsy using only one of the following methods. He or she will do: Fine needle aspiration. A thin needle with a syringe will be inserted into a breast cyst. Fluid and cells will be removed. Core needle biopsy. A wide, hollow needle (core needle) will be inserted into a breast lump multiple times to remove tissue samples or cores. Stereotactic biopsy. X-rays and a computer will be used to locate the breast lump. The surgeon will use the X-ray images to collect several samples of tissue using a needle. Vacuum-assisted biopsy. A small incision will be made in your breast. A hollow needle and vacuum will be passed through the incision and into the breast tissue. The vacuum will gently draw abnormal breast tissue into the needle to remove it. Ultrasound-guided core needle biopsy. An ultrasound will be used to help guide the core needle to the area of the mass or abnormality. An incision will be made to insert the needle. Then tissue samples will be removed. Surgical biopsy. An incision will be made in the breast to remove part or all of the abnormal tissue. After the tissue is removed, the skin over the area  will be closed with sutures and covered with a dressing. There are two types of surgical biopsies: Incisional biopsy. The surgeon will remove part of the breast lump. Excisional biopsy. The surgeon will attempt to remove the whole breast lump or as much of it as possible. After any of these procedures, the tissue or fluid that was removed will beexamined under a microscope. The procedure may vary among health care providers and hospitals. What happens after the procedure? You will be taken to the recovery area. If you are doing well and have no problems, you will be allowed to go home. You may have a pressure dressing applied on your breast for 24-48 hours. You may also be advised to wear a supportive bra during this time. Do not drive for 24 hours if you were given a sedative during your procedure. Summary A breast biopsy is a procedure in which a sample of breast tissue is removed from the breast and examined under a microscope to see if cancerous cells are present. This is a safe procedure, but problems can occur, including bleeding, infection, pain, and bruising. Ask your health care provider about changing or stopping your regular medicines. Plan to have someone take you home from the hospital or clinic.  This information is not intended to replace advice given to you by your health care provider. Make sure you discuss any questions you have with your healthcare provider. Document Revised: 01/09/2020 Document Reviewed: 10/14/2017 Elsevier Patient Education  2022 [employer]. Breast Biopsy, Care After This sheet gives you information about how to care for yourself after your procedure. Your health care provider may also give you more specific instructions. If you have problems or questions, contact your health careprovider. What can I expect after the procedure? After your procedure, it is common to have: Bruising on your breast. Numbness, tingling, or pain near your biopsy site. Follow  these instructions at home: Medicines Take over-the-counter and prescription medicines only as told by your health care provider. Do not drive for 24 hours if you were given a sedative during your procedure. Do not drink alcohol while taking pain medicine. Do not drive or use heavy machinery while taking prescription pain medicine. Biopsy site care     Follow instructions from your health care provider about how to take care of your incision or puncture site. Make sure you: Wash your hands with soap and water before you change your bandage (dressing). If soap and water are not available, use hand sanitizer. Change your dressing as told by your health care provider. Leave any stitches (sutures), skin glue, or adhesive strips in place. These skin closures may need to stay in place for 2 weeks or longer. If adhesive strip edges start to loosen and curl up, you may trim the loose edges. Do not remove adhesive strips completely unless your health care provider tells you to do that. If you have sutures, keep them dry when bathing. Check your incision or puncture area every day for signs of infection. Check for: Redness, swelling, or pain. Fluid or blood. Warmth. Pus or a bad smell. Protect the biopsy area. Do not let the area get bumped. Activity If you had an incision during your procedure, avoid activities that may pull the incision site open. Avoid stretching, reaching above your head, exercise, sports, or lifting anything that is heavier than 3 lb (1.4 kg). Return to your normal activities as told by your health care provider. Ask your health care provider what activities are safe for you. Managing pain If directed, put ice on the biopsy site to relieve tenderness: Put ice in a plastic bag. Place a towel between your skin and the bag. Leave the ice on for 20 minutes, 2-3 times a day. General instructions Resume your usual diet. Wear a good support bra for as long as told by your health  care provider. Get checked for extra fluid around your lymph nodes (lymphedema) as often as told by your health care provider. Keep all follow-up visits as told by your health care provider. This is important. Contact a health care provider if: You have more redness, swelling, or pain at the biopsy site. You have more fluid or blood coming from your biopsy site. Your biopsy site feels warm to the touch. You have pus or a bad smell coming from the biopsy site. Your biopsy site breaks open after the sutures or adhesive strips have been removed. You have a rash. You have a fever. Get help right away if you have: Increased bleeding (more than a small spot) from the biopsy site. Difficulty breathing. Red streaks around the biopsy site. Summary After your procedure, it is common to have bruising, numbness, tingling, or pain near the biopsy site. Do not drive or use  heavy machinery while taking prescription pain medicine. Wear a good support bra for as long as told by your health care provider. If you had an incision during your procedure, avoid activities that may pull the incision site open. Ask your health care provider what activities are safe for you. This information is not intended to replace advice given to you by your health care provider. Make sure you discuss any questions you have with your healthcare provider. Document Revised: 01/09/2020 Document Reviewed: 10/14/2017 Elsevier Patient Education  2022 Elsevier Inc. PATIENT INSTRUCTIONS POST-ANESTHESIA  IMMEDIATELY FOLLOWING SURGERY:  Do not drive or operate machinery for the first twenty four hours after surgery.  Do not make any important decisions for twenty four hours after surgery or while taking narcotic pain medications or sedatives.  If you develop intractable nausea and vomiting or a severe headache please notify your doctor immediately.  FOLLOW-UP:  Please make an appointment with your surgeon as instructed. You do not need  to follow up with anesthesia unless specifically instructed to do so.  WOUND CARE INSTRUCTIONS (if applicable):  Keep a dry clean dressing on the anesthesia/puncture wound site if there is drainage.  Once the wound has quit draining you may leave it open to air.  Generally you should leave the bandage intact for twenty four hours unless there is drainage.  If the epidural site drains for more than 36-48 hours please call the anesthesia department.  QUESTIONS?:  Please feel free to call your physician or the hospital operator if you have any questions, and they will be happy to assist you.

## 2020-11-09 ENCOUNTER — Encounter (HOSPITAL_COMMUNITY): Payer: Self-pay | Admitting: Occupational Therapy

## 2020-11-09 ENCOUNTER — Ambulatory Visit (HOSPITAL_COMMUNITY): Payer: 59 | Attending: Orthopaedic Surgery | Admitting: Occupational Therapy

## 2020-11-09 ENCOUNTER — Other Ambulatory Visit: Payer: Self-pay

## 2020-11-09 ENCOUNTER — Encounter (HOSPITAL_COMMUNITY)
Admission: RE | Admit: 2020-11-09 | Discharge: 2020-11-09 | Disposition: A | Discharge status: Home / Self Care | Payer: 59 | Source: Ambulatory Visit | Attending: General Surgery | Admitting: General Surgery

## 2020-11-09 DIAGNOSIS — M25531 Pain in right wrist: Secondary | ICD-10-CM | POA: Insufficient documentation

## 2020-11-09 DIAGNOSIS — R600 Localized edema: Secondary | ICD-10-CM | POA: Insufficient documentation

## 2020-11-09 DIAGNOSIS — R29898 Other symptoms and signs involving the musculoskeletal system: Secondary | ICD-10-CM | POA: Insufficient documentation

## 2020-11-09 DIAGNOSIS — Z01812 Encounter for preprocedural laboratory examination: Secondary | ICD-10-CM | POA: Diagnosis present

## 2020-11-09 LAB — BASIC METABOLIC PANEL
Anion gap: 10 (ref 5–15)
BUN: 16 mg/dL (ref 6–20)
CO2: 27 mmol/L (ref 22–32)
Calcium: 9.2 mg/dL (ref 8.9–10.3)
Chloride: 103 mmol/L (ref 98–111)
Creatinine, Ser: 0.68 mg/dL (ref 0.44–1.00)
GFR, Estimated: >60 mL/min (ref >60)
Glucose, Bld: 78 mg/dL (ref 70–99)
Potassium: 3.6 mmol/L (ref 3.5–5.1)
Sodium: 140 mmol/L (ref 135–145)

## 2020-11-09 LAB — CBC WITH DIFFERENTIAL/PLATELET
Abs Immature Granulocytes: 0.01 10*3/uL (ref 0.00–0.07)
Basophils Absolute: 0 10*3/uL (ref 0.0–0.1)
Basophils Relative: 1 %
Eosinophils Absolute: 0.2 10*3/uL (ref 0.0–0.5)
Eosinophils Relative: 3 %
HCT: 39 % (ref 36.0–46.0)
Hemoglobin: 12.7 g/dL (ref 12.0–15.0)
Immature Granulocytes: 0 %
Lymphocytes Relative: 28 %
Lymphs Abs: 1.6 10*3/uL (ref 0.7–4.0)
MCH: 29.6 pg (ref 26.0–34.0)
MCHC: 32.6 g/dL (ref 30.0–36.0)
MCV: 90.9 fL (ref 80.0–100.0)
Monocytes Absolute: 0.5 10*3/uL (ref 0.1–1.0)
Monocytes Relative: 9 %
Neutro Abs: 3.5 10*3/uL (ref 1.7–7.7)
Neutrophils Relative %: 59 %
Platelets: 306 10*3/uL (ref 150–400)
RBC: 4.29 MIL/uL (ref 3.87–5.11)
RDW: 13.4 % (ref 11.5–15.5)
WBC: 5.8 10*3/uL (ref 4.0–10.5)
nRBC: 0 % (ref 0.0–0.2)

## 2020-11-09 MED ORDER — LACTATED RINGERS IV SOLN: INTRAVENOUS | Status: DC

## 2020-11-09 NOTE — Patient Instructions (Signed)
AROM Exercises:  *Complete exercises __10____ times each, __3_____ times per day*  1) Wrist Flexion  Start with wrist at edge of table, palm facing up. With wrist hanging slightly off table, curl wrist upward, and back down.      2) Wrist Extension  Start with wrist at edge of table, palm facing down. With wrist slightly off the edge of the table, curl wrist up and back down.      3) Radial Deviations  Start with forearm flat against a table, wrist hanging slightly off the edge, and palm facing the wall. Bending at the wrist only, and keeping palm facing the wall, bend wrist so fist is pointing towards the floor, back up to start position, and up towards the ceiling. Return to start.        4) WRIST PRONATION  Turn your forearm towards palm face down.  Keep your elbow bent and by the side of your  Body.      5) WRIST SUPINATION  Turn your forearm towards palm face up.  Keep your elbow bent and by the side of your  Body.             

## 2020-11-09 NOTE — Therapy (Signed)
[facility] [address] [place], [place], [address] Phone: [phone]   Fax:  [phone]  Occupational Therapy Evaluation  Patient Details  Name: Amber Maizesnamarcela Simpson MRN: [id number] Date of Birth: 02/21/1966 Referring Provider (OT): Dr. Darreld McleanWayne Keeling   Encounter Date: 11/09/2020   OT End of Session - 11/09/20 0846     Visit Number 1    Number of Visits 6    Date for OT Re-Evaluation 12/09/20    Authorization Type [employer], $5 copay    Authorization Time Period 30 visit limit, 0 used at evaluation    Authorization - Visit Number 1    Authorization - Number of Visits 30    OT Start Time 0807    OT Stop Time [phone]    OT Time Calculation (min) 35 min    Activity Tolerance Patient tolerated treatment well    Behavior During Therapy [facility] for tasks assessed/performed             Past Medical History:  Diagnosis Date   Allergy    sutures- she states she her wounds open up around the 3rd day post-op   Migraines    Vertigo     Past Surgical History:  Procedure Laterality Date   BREAST REDUCTION SURGERY Bilateral    COLONOSCOPY WITH PROPOFOL N/A 08/31/2020   Procedure: COLONOSCOPY WITH PROPOFOL;  Surgeon: Lanelle Balarver, Charles K, DO;  Location: [facility];  Service: Endoscopy;  Laterality: N/A;  ASA I / II / PM procedure   MOUTH SURGERY     OTHER SURGICAL HISTORY     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   Subjective Assessment - 11/09/20 0843     Subjective  S: This has been going on for a year.    Pertinent History Pt is a 55 y/o female presenting with right wrist pain that began approximately 1 year ago when she fell backwards and landed on her arm. Pt reports she was scheduled for an MRI [place] and [place], and now insurance requires therapy  prior to approval for another MRI order. Pt was referred to occupational therapy for evaluation and treatment by Dr. Darreld McleanWayne Keeling.    Special Tests DASH: 38.64    Patient Stated Goals To have less pain in my wrist.    Currently in Pain? No/denies   with certain movements              [facility] OT Assessment - 11/09/20 0802       Assessment   Medical Diagnosis right wrist pain    Referring Provider (OT) Dr. Darreld McleanWayne Keeling    Onset Date/Surgical Date --   approximately 1 year ago   Hand Dominance Right    Next MD Visit None scheduled    Prior Therapy None      Precautions   Precautions None      Balance Screen   Has the patient fallen in the past 6 months No      Prior Function   Level of Independence Independent    Leisure pt is building a house, exercising,      ADL   ADL comments pt is having difficulty with writing, grip items, wipe the sink out. Pt is  having significant pain with certain movements, carrying heavy items is difficult, has pain with many motions. Pt is having difficulty sleeping, wakes up with tingling sensation at times.      Written Expression   Dominant Hand Right      Cognition   Overall Cognitive Status Within Functional Limits for tasks assessed      Observation/Other Assessments   Other Surveys  Select    Quick DASH  38.64      Edema   Edema Wrist: R-16.0cm, L-15.5cm; immediately distal to elbow: R-25cm, L-23.5cm      ROM / Strength   AROM / PROM / Strength AROM;Strength      Palpation   Palpation comment mod fascial restrictions along dorsal wrist and forearm      AROM   AROM Assessment Site Wrist;Forearm    Right/Left Forearm Right    Right Forearm Pronation 90 Degrees    Right Forearm Supination 90 Degrees    Right/Left Wrist Right    Right Wrist Extension 50 Degrees    Right Wrist Flexion 58 Degrees    Right Wrist Radial Deviation 21 Degrees    Right Wrist Ulnar Deviation 40 Degrees      Strength   Strength Assessment Site  Wrist;Hand;Forearm    Right/Left Forearm Right    Right Forearm Pronation 5/5   with pain   Right Forearm Supination 4+/5   with pain   Right/Left Wrist Right    Right Wrist Flexion 4+/5   with pain   Right Wrist Extension 5/5   with pain   Right Wrist Radial Deviation 4+/5   no pain   Right Wrist Ulnar Deviation 5/5   no pain   Right/Left hand Right;Left    Right Hand Gross Grasp Functional    Right Hand Grip (lbs) 65    Right Hand Lateral Pinch 16 lbs    Right Hand 3 Point Pinch 10 lbs    Left Hand Gross Grasp Functional    Left Hand Grip (lbs) 60    Left Hand Lateral Pinch 18 lbs    Left Hand 3 Point Pinch 16 lbs                 Neldon Mc - 11/09/20 0815     Open a tight or new jar Unable    Do heavy household chores (wash walls, wash floors) Moderate difficulty    Carry a shopping bag or briefcase Mild difficulty    Wash your back Mild difficulty    Use a knife to cut food No difficulty    Recreational activities in which you take some force or impact through your arm, shoulder, or hand (golf, hammering, tennis) Moderate difficulty    During the past week, to what extent has your arm, shoulder or hand problem interfered with your normal social activities with family, friends, neighbors, or groups? Not at all    During the past week, to what extent has your arm, shoulder or hand problem limited your work or other regular daily activities [place]    Arm, shoulder, or hand pain. Moderate    Tingling (pins and needles) in your arm, shoulder, or hand Mild    Difficulty Sleeping Moderate difficulty    DASH Score 38.64 %                        OT Education - 11/09/20 0837     Education Details wrist A/ROM    Person(s)  Educated Patient    Methods Explanation;Demonstration;Handout    Comprehension Verbalized understanding;Returned demonstration              OT Short Term Goals - 11/09/20 0850       OT SHORT TERM GOAL #1   Title Pt will be  provided with and educated on HEP to improve mobility in wrist required for ADLs.    Time 3    Period Weeks    Status New    Target Date 12/09/20      OT SHORT TERM GOAL #2   Title Pt will decrease pain in right wrist to 3/10 or less improve ability to perform housekeeping tasks with less pain and compensatory motions.    Time 3    Period Weeks    Status New      OT SHORT TERM GOAL #3   Title Pt will decrease fascial restrictions in right wrist to min amounts or less to improve mobility required for reaching tasks.    Time 3    Period Weeks    Status New      OT SHORT TERM GOAL #4   Title Pt will increase right pinch strength by 3# to improve ability to perform writing tasks, maintaining grip on utensils.    Time 3    Period Weeks    Status New      OT SHORT TERM GOAL #5   Title Pt will improve A/ROM of right wrist by 10 degrees to improve mobility required for tasks when working on her house.    Time 3    Period Weeks    Status New                      Plan - 11/09/20 0847     Clinical Impression Statement A: Pt is a 55 y/o female presenting with right wrist pain present for approximately 1 year. Pt wore a thumb spica splint for a while which assisted with thumb pain, she is now able to use her hand however certain motions and anything requiring strength or pressure to the wrist is painful. Of note: pt with clicking sound in wrist with certain motions, observed with supination during evaluation.    OT Occupational Profile and History Problem Focused Assessment - Including review of records relating to presenting problem    Occupational performance deficits (Please refer to evaluation for details): ADL's;IADL's;Rest and Sleep;Work;Leisure    Body Structure / Function / Physical Skills ADL;Endurance;UE functional use;Fascial restriction;Pain;ROM;Sensation;IADL;Strength;Edema    Rehab Potential Good    Clinical Decision Making Limited treatment options, no task  modification necessary    Comorbidities Affecting Occupational Performance: None    Modification or Assistance to Complete Evaluation  No modification of tasks or assist necessary to complete eval    OT Frequency 2x / week    OT Duration --   3 weeks   OT Treatment/Interventions Self-care/ADL training;Ultrasound;DME and/or AE instruction;Patient/family education;Passive range of motion;Cryotherapy;Electrical Stimulation;Splinting;Moist Heat;Therapeutic exercise;Manual Therapy;Therapeutic activities    Plan P: Pt will benefit from skilled OT services to decrease pain and fascial restrictions, increase joint ROM, strength, and functional use of RUE. Treatment plan: myofascial release, manual techniques, P/ROM, A/ROM, general RUE strengthening, grip and pinch strengthening, modalities prn    OT Home Exercise Plan eval: Wrist A/ROM    Consulted and Agree with Plan of Care Patient             Patient will benefit from skilled therapeutic intervention  in order to improve the following deficits and impairments:   Body Structure / Function / Physical Skills: ADL, Endurance, UE functional use, Fascial restriction, Pain, ROM, Sensation, IADL, Strength, Edema       Visit Diagnosis: Pain in right wrist  Other symptoms and signs involving the musculoskeletal system  Localized edema    Problem List 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

## 2020-11-13 ENCOUNTER — Ambulatory Visit (HOSPITAL_COMMUNITY)
Admission: RE | Admit: 2020-11-13 | Discharge: 2020-11-13 | Disposition: A | Discharge status: Home / Self Care | Payer: 59 | Source: Ambulatory Visit | Attending: General Surgery | Admitting: General Surgery

## 2020-11-13 DIAGNOSIS — N6489 Other specified disorders of breast: Secondary | ICD-10-CM | POA: Insufficient documentation

## 2020-11-13 MED ORDER — LIDOCAINE HCL 2 % IJ SOLN
INTRAMUSCULAR | Status: AC
  Filled 2020-11-13: qty 10

## 2020-11-14 ENCOUNTER — Ambulatory Visit: Payer: Self-pay | Admitting: *Deleted

## 2020-11-14 ENCOUNTER — Encounter (HOSPITAL_COMMUNITY)
Admission: RE | Disposition: A | Discharge status: Home / Self Care | Payer: Self-pay | Source: Home / Self Care | Attending: General Surgery

## 2020-11-14 ENCOUNTER — Ambulatory Visit (HOSPITAL_COMMUNITY): Payer: 59 | Admitting: Anesthesiology

## 2020-11-14 ENCOUNTER — Encounter (HOSPITAL_COMMUNITY): Payer: Self-pay | Admitting: General Surgery

## 2020-11-14 ENCOUNTER — Ambulatory Visit (HOSPITAL_COMMUNITY)
Admission: RE | Admit: 2020-11-14 | Discharge: 2020-11-14 | Disposition: A | Discharge status: Home / Self Care | Payer: 59 | Attending: General Surgery | Admitting: General Surgery

## 2020-11-14 ENCOUNTER — Ambulatory Visit (HOSPITAL_COMMUNITY): Payer: 59

## 2020-11-14 DIAGNOSIS — N6489 Other specified disorders of breast: Secondary | ICD-10-CM | POA: Diagnosis present

## 2020-11-14 DIAGNOSIS — R928 Other abnormal and inconclusive findings on diagnostic imaging of breast: Secondary | ICD-10-CM

## 2020-11-14 DIAGNOSIS — Z87891 Personal history of nicotine dependence: Secondary | ICD-10-CM | POA: Insufficient documentation

## 2020-11-14 HISTORY — PX: BREAST LUMPECTOMY WITH RADIOFREQUENCY TAG IDENTIFICATION: SHX6884

## 2020-11-14 SURGERY — BREAST LUMPECTOMY WITH RADIOFREQUENCY TAG IDENTIFICATION
Anesthesia: General | Site: Breast | Laterality: Left

## 2020-11-14 MED ORDER — SODIUM CHLORIDE 0.9 % IR SOLN
Status: DC | PRN
  Administered 2020-11-14: 1000 mL

## 2020-11-14 MED ORDER — CHLORHEXIDINE GLUCONATE 0.12 % MT SOLN
15.0000 mL | Freq: Once | OROMUCOSAL | Status: AC
  Administered 2020-11-14: 15 mL via OROMUCOSAL
  Filled 2020-11-14: qty 15

## 2020-11-14 MED ORDER — LIDOCAINE HCL (CARDIAC) PF 100 MG/5ML IV SOSY
PREFILLED_SYRINGE | INTRAVENOUS | Status: DC | PRN
  Administered 2020-11-14: 80 mg via INTRAVENOUS

## 2020-11-14 MED ORDER — FENTANYL CITRATE (PF) 100 MCG/2ML IJ SOLN
INTRAMUSCULAR | Status: DC | PRN
  Administered 2020-11-14 (×2): 25 ug via INTRAVENOUS
  Administered 2020-11-14: 50 ug via INTRAVENOUS

## 2020-11-14 MED ORDER — FENTANYL CITRATE (PF) 100 MCG/2ML IJ SOLN: 25.0000 ug | INTRAMUSCULAR | Status: DC | PRN

## 2020-11-14 MED ORDER — MIDAZOLAM HCL 5 MG/5ML IJ SOLN
INTRAMUSCULAR | Status: DC | PRN
  Administered 2020-11-14: 2 mg via INTRAVENOUS

## 2020-11-14 MED ORDER — ONDANSETRON HCL 4 MG/2ML IJ SOLN
INTRAMUSCULAR | Status: AC
  Filled 2020-11-14: qty 2

## 2020-11-14 MED ORDER — CHLORHEXIDINE GLUCONATE CLOTH 2 % EX PADS: 6.0000 | MEDICATED_PAD | Freq: Once | CUTANEOUS | Status: DC

## 2020-11-14 MED ORDER — ONDANSETRON HCL 4 MG/2ML IJ SOLN: 4.0000 mg | Freq: Once | INTRAMUSCULAR | Status: DC

## 2020-11-14 MED ORDER — LACTATED RINGERS IV SOLN
INTRAVENOUS | Status: DC
  Administered 2020-11-14: 1000 mL via INTRAVENOUS

## 2020-11-14 MED ORDER — OXYCODONE HCL 5 MG PO TABS: 5.0000 mg | ORAL_TABLET | ORAL | 0 refills | Status: DC | PRN

## 2020-11-14 MED ORDER — BUPIVACAINE HCL (PF) 0.5 % IJ SOLN
INTRAMUSCULAR | Status: DC | PRN
  Administered 2020-11-14: 10 mL

## 2020-11-14 MED ORDER — PROPOFOL 10 MG/ML IV BOLUS
INTRAVENOUS | Status: DC | PRN
  Administered 2020-11-14: 200 mg via INTRAVENOUS
  Administered 2020-11-14: 25 ug/kg/min via INTRAVENOUS

## 2020-11-14 MED ORDER — BUPIVACAINE HCL (PF) 0.5 % IJ SOLN
INTRAMUSCULAR | Status: AC
  Filled 2020-11-14: qty 30

## 2020-11-14 MED ORDER — CEFAZOLIN SODIUM-DEXTROSE 2-4 GM/100ML-% IV SOLN
2.0000 g | INTRAVENOUS | Status: AC
  Administered 2020-11-14: 2 g via INTRAVENOUS
  Filled 2020-11-14: qty 100

## 2020-11-14 MED ORDER — MIDAZOLAM HCL 2 MG/2ML IJ SOLN
INTRAMUSCULAR | Status: AC
  Filled 2020-11-14: qty 2

## 2020-11-14 MED ORDER — LIDOCAINE HCL (PF) 2 % IJ SOLN
INTRAMUSCULAR | Status: AC
  Filled 2020-11-14: qty 5

## 2020-11-14 MED ORDER — FENTANYL CITRATE (PF) 100 MCG/2ML IJ SOLN
INTRAMUSCULAR | Status: AC
  Filled 2020-11-14: qty 2

## 2020-11-14 MED ORDER — ONDANSETRON HCL 4 MG PO TABS: 4.0000 mg | ORAL_TABLET | Freq: Three times a day (TID) | ORAL | 1 refills | Status: DC | PRN

## 2020-11-14 MED ORDER — PROPOFOL 10 MG/ML IV BOLUS
INTRAVENOUS | Status: AC
  Filled 2020-11-14: qty 40

## 2020-11-14 MED ORDER — EPHEDRINE 5 MG/ML INJ
INTRAVENOUS | Status: AC
  Filled 2020-11-14: qty 10

## 2020-11-14 MED ORDER — ONDANSETRON HCL 4 MG/2ML IJ SOLN
INTRAMUSCULAR | Status: DC | PRN
  Administered 2020-11-14: 4 mg via INTRAVENOUS

## 2020-11-14 MED ORDER — DEXAMETHASONE SODIUM PHOSPHATE 10 MG/ML IJ SOLN
INTRAMUSCULAR | Status: AC
  Filled 2020-11-14: qty 1

## 2020-11-14 MED ORDER — ORAL CARE MOUTH RINSE: 15.0000 mL | Freq: Once | OROMUCOSAL | Status: AC

## 2020-11-14 MED ORDER — EPHEDRINE SULFATE 50 MG/ML IJ SOLN
INTRAMUSCULAR | Status: DC | PRN
  Administered 2020-11-14: 5 mg via INTRAVENOUS

## 2020-11-14 MED ORDER — DEXAMETHASONE SODIUM PHOSPHATE 10 MG/ML IJ SOLN
INTRAMUSCULAR | Status: DC | PRN
  Administered 2020-11-14: 5 mg via INTRAVENOUS

## 2020-11-14 SURGICAL SUPPLY — 39 items
ADH SKN CLS APL DERMABOND .7 (GAUZE/BANDAGES/DRESSINGS) ×1
APL PRP STRL LF DISP 70% ISPRP (MISCELLANEOUS) ×1
BLADE SURG 15 STRL LF DISP TIS (BLADE) ×1 IMPLANT
BLADE SURG 15 STRL SS (BLADE) ×2
CHLORAPREP W/TINT 26 (MISCELLANEOUS) ×2 IMPLANT
CLOTH BEACON ORANGE TIMEOUT ST (SAFETY) ×2 IMPLANT
COVER LIGHT HANDLE STERIS (MISCELLANEOUS) ×4 IMPLANT
DECANTER SPIKE VIAL GLASS SM (MISCELLANEOUS) IMPLANT
DERMABOND ADVANCED (GAUZE/BANDAGES/DRESSINGS) ×1
DERMABOND ADVANCED .7 DNX12 (GAUZE/BANDAGES/DRESSINGS) ×1 IMPLANT
DRSG TEGADERM 4X4.75 (GAUZE/BANDAGES/DRESSINGS) ×4 IMPLANT
ELECT REM PT RETURN 9FT ADLT (ELECTROSURGICAL) ×2
ELECTRODE REM PT RTRN 9FT ADLT (ELECTROSURGICAL) ×1 IMPLANT
GAUZE 4X4 16PLY ~~LOC~~+RFID DBL (SPONGE) ×2 IMPLANT
GLOVE SRG 8 PF TXTR STRL LF DI (GLOVE) ×1 IMPLANT
GLOVE SURG ENC MOIS LTX SZ6.5 (GLOVE) ×2 IMPLANT
GLOVE SURG UNDER POLY LF SZ6.5 (GLOVE) ×2 IMPLANT
GLOVE SURG UNDER POLY LF SZ7 (GLOVE) ×4 IMPLANT
GLOVE SURG UNDER POLY LF SZ8 (GLOVE) ×2
GOWN STRL REUS W/TWL LRG LVL3 (GOWN DISPOSABLE) ×4 IMPLANT
GOWN STRL REUS W/TWL XL LVL3 (GOWN DISPOSABLE) ×2 IMPLANT
KIT MARKER MARGIN INK (KITS) ×2 IMPLANT
KIT TURNOVER KIT A (KITS) ×2 IMPLANT
MANIFOLD NEPTUNE II (INSTRUMENTS) ×2 IMPLANT
NEEDLE HYPO 25X1 1.5 SAFETY (NEEDLE) ×2 IMPLANT
NS IRRIG 1000ML POUR BTL (IV SOLUTION) ×2 IMPLANT
PACK MINOR (CUSTOM PROCEDURE TRAY) ×2 IMPLANT
PAD ARMBOARD 7.5X6 YLW CONV (MISCELLANEOUS) ×2 IMPLANT
SET BASIN LINEN APH (SET/KITS/TRAYS/PACK) ×2 IMPLANT
SET LOCALIZER 20 PROBE US (MISCELLANEOUS) ×2 IMPLANT
SPONGE GAUZE 4X4 12PLY (GAUZE/BANDAGES/DRESSINGS) ×2 IMPLANT
SUT ETHILON 3 0 PS 1 (SUTURE) ×2 IMPLANT
SUT ETHILON 3 0 PS 1 18 (SUTURE) ×2 IMPLANT
SUT MNCRL AB 4-0 PS2 18 (SUTURE) ×4 IMPLANT
SUT SILK 2 0 SH (SUTURE) ×2 IMPLANT
SUT VIC AB 3-0 SH 27 (SUTURE) ×4
SUT VIC AB 3-0 SH 27X BRD (SUTURE) ×2 IMPLANT
SYR 30ML LL (SYRINGE) ×2 IMPLANT
SYR CONTROL 10ML LL (SYRINGE) ×2 IMPLANT

## 2020-11-14 NOTE — Transfer of Care (Signed)
Immediate Anesthesia Transfer of Care Note  Patient: Amber Simpson  Procedure(s) Performed: BREAST BIOPSY WITH RADIOFREQUENCY TAG IDENTIFICATION (Left: Breast)  Patient Location: PACU  Anesthesia Type:General  Level of Consciousness: drowsy, patient cooperative and responds to stimulation  Airway & Oxygen Therapy: Patient Spontanous Breathing and Patient connected to nasal cannula oxygen  Post-op Assessment: Report given to RN, Post -op Vital signs reviewed and stable and Patient moving all extremities X 4  Post vital signs: Reviewed and stable  Last Vitals:  Vitals Value Taken Time  BP    Temp    Pulse    Resp    SpO2      Last Pain:  Vitals:   11/14/20 0646  TempSrc: Oral  PainSc: 2       Patients Stated Pain Goal: 8 (11/14/20 0646)  Complications: No notable events documented.

## 2020-11-14 NOTE — Op Note (Signed)
[facility] Operative Note  11/14/20  Preoperative Diagnosis: Left breast mass, complex sclerosing lesion    Postoperative Diagnosis: Same   Procedure(s) Performed: Left excisional breast biopsy after radiofrequency tag placement    Surgeon: Leatrice Jewels. Henreitta Leber, MD   Assistants: No qualified resident was available    Anesthesia: General endotracheal   Anesthesiologist: Dr. Deatra Canter    Specimens: Left breast mass (marked with paint)    Estimated Blood Loss: Minimal   Blood Replacement: None    Complications: None   Wound Class: Clean    Operative Indications: Amber Simpson is a 55 yo with an area on mammogram that was biopsied and demonstrated a complex sclerosing lesion. We discussed excision and risk of bleeding, infection, cosmetic changes, and risk of finding a cancer and needing more surgery or treatment. She reported having issues with wound breakdown after what sounded like subcuticular sutures for her breast augmentation, and we discussed using sutures that would be removed on her skin.   Findings: Normal breat tissue, biopsy clip and radiofrequency tag in specimen on mammogram    Procedure: The patient was taken to the operating room and placed supine. General endotracheal anesthesia was induced. Intravenous antibiotics were  administered per protocol.  The left breast was prepared and draped in the usual sterile fashion.   The radiofrequency probe was used to identify the closest point on the skin to the area and this was in the upper inner portion of the breast. A curvilinear incision was made and carried down to the breast tissue. Flaps were created. Using the radiofrequency probe the specimen was excised with sharp dissection with scissors with adequate margins around the radiofrequency tag. The specimen was painted with the standard marking paint.  A mammogram confirmed biopsy clip and radiofrequency tag placement.   The wound was made hemostatic. Deep  sutures of 3-0 Interrupted Vicryl were placed to close the cavity and due to her issue with subcuticular sutures previously, 3-0 Nylon interrupted with vertical mattresses between were placed to close the skin. A gauze and tegaderm were placed.   Final inspection revealed acceptable hemostasis. All counts were correct at the end of the case. The patient was awakened from anesthesia and extubated without complication.  The patient went to the PACU in stable condition.   Algis Greenhouse, MD [facility] [address] [place] [place], [place] [address] [phone] (office)

## 2020-11-14 NOTE — Anesthesia Procedure Notes (Signed)
Procedure Name: LMA Insertion Date/Time: 11/14/2020 7:37 AM Performed by: Brynda Peon, CRNA Pre-anesthesia Checklist: Patient identified, Emergency Drugs available, Suction available, Timeout performed and Patient being monitored Patient Re-evaluated:Patient Re-evaluated prior to induction Oxygen Delivery Method: Circle system utilized Preoxygenation: Pre-oxygenation with 100% oxygen Induction Type: IV induction LMA: LMA inserted LMA Size: 4.0 Number of attempts: 1 Placement Confirmation: positive ETCO2, CO2 detector and breath sounds checked- equal and bilateral Tube secured with: Tape Dental Injury: Teeth and Oropharynx as per pre-operative assessment

## 2020-11-14 NOTE — Anesthesia Preprocedure Evaluation (Signed)
Anesthesia Evaluation  Patient identified by MRN, date of birth, ID band Patient awake    Reviewed: Allergy & Precautions, NPO status , Patient's Chart, lab work & pertinent test results  History of Anesthesia Complications Negative for: history of anesthetic complications  Airway Mallampati: I  TM Distance: >3 FB Neck ROM: Full   Comment: Neck pain, tingling and numbness in her left hand Dental  (+) Dental Advisory Given, Missing   Pulmonary former smoker,    Pulmonary exam normal breath sounds clear to auscultation       Cardiovascular Exercise Tolerance: Good Normal cardiovascular exam+ dysrhythmias (PVCs)  Rhythm:Regular Rate:Normal     Neuro/Psych  Headaches,  Neuromuscular disease (tingling sensation and numbness in her left hand) negative psych ROS   GI/Hepatic negative GI ROS, Neg liver ROS,   Endo/Other  negative endocrine ROS  Renal/GU negative Renal ROS     Musculoskeletal Neck pain    Abdominal   Peds  Hematology negative hematology ROS (+)   Anesthesia Other Findings Vertigo   Reproductive/Obstetrics negative OB ROS                            Anesthesia Physical Anesthesia Plan  ASA: 2  Anesthesia Plan: General   Post-op Pain Management:    Induction: Intravenous  PONV Risk Score and Plan: 4 or greater and Ondansetron, Dexamethasone and Midazolam  Airway Management Planned: LMA  Additional Equipment:   Intra-op Plan:   Post-operative Plan: Extubation in OR  Informed Consent: I have reviewed the patients History and Physical, chart, labs and discussed the procedure including the risks, benefits and alternatives for the proposed anesthesia with the patient or authorized representative who has indicated his/her understanding and acceptance.     Dental advisory given  Plan Discussed with: CRNA and Surgeon  Anesthesia Plan Comments:         Anesthesia  Quick Evaluation

## 2020-11-14 NOTE — Discharge Instructions (Signed)
Discharge instructions after breast surgery:   Common Complaints: Pain and bruising at the incision sites.  Swelling at the incision sites.  Diet/ Activity: Diet as tolerated.  Leave the gauze and bandage in place for 48 hours. Due to your prior issue with the stitches under the skin, I placed stitches that will need to be removed next week.  Keep the area covered after removing the bandage. If the initial bandage gets saturated with drainage it is ok to replace it.  You can shower but avoid getting the bandage wet. After 48 hours you can get the stitches wet in the shower but do not submerge in water.  After 48 hours you can put neosporin on the stitches and cover the area with a large bandaid daily.   Rest and listen to your body, but do not remain in bed all day.  Walk everyday for at least 15-20 minutes. Deep cough and move around every 1-2 hours in the first few days after surgery.  Do not lift > 10 lbs for the first 2 weeks after surgery. Do not do anything that makes you feel like you are putting unnecessary pull or stretch on the incision sites.  Do move your arm and shoulder. If you do not move then you can get stiff and hurt more.  Wear a supportive bra / sports bra if desired.    Pain Expectations and Narcotics: -After surgery you will have pain associated with your incisions and this is normal. The pain is muscular and nerve pain, and will get better with time. -You are encouraged and expected to take non narcotic medications like tylenol and ibuprofen (when able) to treat pain as multiple modalities can aid with pain treatment. -Narcotics are only used when pain is severe or there is breakthrough pain. -You are not expected to have a pain score of 0 after surgery, as we cannot prevent pain. A pain score of 3-4 that allows you to be functional, move, walk, and tolerate some activity is the goal. The pain will continue to improve over the days after surgery and is dependent on your  surgery. -Due to [place] law, we are only able to give a certain amount of pain medication to treat post operative pain, and we only give additional narcotics on a patient by patient basis.  -For most laparoscopic surgery, studies have shown that the majority of patients only need 10-15 narcotic pills, and for open surgeries most patients only need 15-20.   -Having appropriate expectations of pain and knowledge of pain management with non narcotics is important as we do not want anyone to become addicted to narcotic pain medication.  -Using ice packs in the first 48 hours and heating pads after 48 hours, and using over the counter medications are all ways to help with pain management.   -Simple acts like meditation and mindfulness practices after surgery can also help with pain control and research has proven the benefit of these practices.  Medication: Take tylenol and ibuprofen as needed for pain control, alternating every 4-6 hours.  Example:  Tylenol 1000mg  @ 6am, 12noon, 6pm, (Do not exceed 4000mg  of tylenol a day). Ibuprofen 800mg  @ 9am, 3pm, 9pm, 3am (Do not exceed 3600mg  of ibuprofen a day).  Take Roxicodone for breakthrough pain every 4 hours.  Take Colace for constipation related to narcotic pain medication. If you do not have a bowel movement in 2 days, take Miralax over the counter.  Drink plenty of water to also  prevent constipation.   Contact Information: If you have questions or concerns, please call our office, [phone], Monday- Thursday 8AM-5PM and Friday 8AM-12Noon.  If it is after hours or on the weekend, please call [facility] Main Number, [phone], [phone], and ask to speak to the surgeon on call for Dr. Henreitta Leber at [facility].

## 2020-11-14 NOTE — Interval H&P Note (Signed)
History and Physical Interval Note:  11/14/2020 7:22 AM  Amber Simpson  has presented today for surgery, with the diagnosis of complex sclerosing lesion left breast.  The various methods of treatment have been discussed with the patient and family. After consideration of risks, benefits and other options for treatment, the patient has consented to  Procedure(s): BREAST BIOPSY WITH RADIOFREQUENCY TAG IDENTIFICATION (Left) as a surgical intervention.  The patient's history has been reviewed, patient examined, no change in status, stable for surgery.  I have reviewed the patient's chart and labs.  Questions were answered to the patient's satisfaction.    Marked no changes.   Lucretia Roers

## 2020-11-14 NOTE — Progress Notes (Signed)
[facility]  Updated husband about surgery. Will get sutures out 7/14.   Algis Greenhouse, MD [facility] [address] [place], [place] [address] [phone] (office)

## 2020-11-14 NOTE — Anesthesia Postprocedure Evaluation (Signed)
Anesthesia Post Note  Patient: Amber Simpson  Procedure(s) Performed: BREAST BIOPSY WITH RADIOFREQUENCY TAG IDENTIFICATION (Left: Breast)  Patient location during evaluation: PACU Anesthesia Type: General Level of consciousness: awake and alert and oriented Pain management: pain level controlled Vital Signs Assessment: post-procedure vital signs reviewed and stable Respiratory status: spontaneous breathing and respiratory function stable Cardiovascular status: blood pressure returned to baseline and stable Postop Assessment: no apparent nausea or vomiting Anesthetic complications: no   No notable events documented.   Last Vitals:  Vitals:   11/14/20 0930 11/14/20 0941  BP: 121/88 117/86  Pulse: 80 76  Resp: 14 14  Temp:  36.5 C  SpO2: 100% 99%    Last Pain:  Vitals:   11/14/20 0941  TempSrc: Oral  PainSc: 2                  Celie Desrochers C [place]

## 2020-11-15 ENCOUNTER — Encounter (HOSPITAL_COMMUNITY): Payer: Self-pay | Admitting: General Surgery

## 2020-11-15 ENCOUNTER — Ambulatory Visit: Payer: 59 | Admitting: Gastroenterology

## 2020-11-15 ENCOUNTER — Encounter: Payer: Self-pay | Admitting: Internal Medicine

## 2020-11-15 LAB — SURGICAL PATHOLOGY

## 2020-11-15 NOTE — Telephone Encounter (Signed)
Attempted to reach pt. X 3, messages left to call back in regard to pt. Having migraine headaches.

## 2020-11-19 ENCOUNTER — Encounter (HOSPITAL_COMMUNITY): Payer: 59

## 2020-11-19 ENCOUNTER — Telehealth (INDEPENDENT_AMBULATORY_CARE_PROVIDER_SITE_OTHER): Payer: 59 | Admitting: General Surgery

## 2020-11-19 DIAGNOSIS — N6489 Other specified disorders of breast: Secondary | ICD-10-CM

## 2020-11-19 NOTE — Telephone Encounter (Signed)
[facility]  Pathology without any cancer. Updated patient and left message. Sutures out later this week.   FINAL MICROSCOPIC DIAGNOSIS:   A. BREAST, LEFT, LUMPECTOMY:  -  Columnar cell and fibrocystic changes  -  Previous biopsy site changes  -  No malignancy identified  Algis Greenhouse, MD [facility] [address] [place], [place] [address] [phone] (office)

## 2020-11-21 ENCOUNTER — Encounter (HOSPITAL_COMMUNITY): Payer: Self-pay | Admitting: Occupational Therapy

## 2020-11-21 ENCOUNTER — Ambulatory Visit (HOSPITAL_COMMUNITY): Payer: Self-pay | Admitting: Occupational Therapy

## 2020-11-21 ENCOUNTER — Other Ambulatory Visit: Payer: Self-pay

## 2020-11-21 DIAGNOSIS — M25531 Pain in right wrist: Secondary | ICD-10-CM

## 2020-11-21 DIAGNOSIS — R29898 Other symptoms and signs involving the musculoskeletal system: Secondary | ICD-10-CM

## 2020-11-21 NOTE — Therapy (Signed)
[facility] [address] [place], [place], [address] Phone: [phone]   Fax:  [phone]  Occupational Therapy Treatment  Patient Details  Name: Amber Simpson MRN: [id number] Date of Birth: 1965/11/30 Referring Provider (OT): Dr. Darreld Mclean   Encounter Date: 11/21/2020   OT End of Session - 11/21/20 1742     Visit Number 2    Number of Visits 6    Date for OT Re-Evaluation 12/09/20    Authorization Type [employer], $5 copay    Authorization Time Period 30 visit limit, 0 used at evaluation    Authorization - Visit Number 2    Authorization - Number of Visits 30    OT Start Time 1646    OT Stop Time 1725    OT Time Calculation (min) 39 min    Activity Tolerance Patient tolerated treatment well    Behavior During Therapy [facility] for tasks assessed/performed             Past Medical History:  Diagnosis Date   Allergy    sutures- she states she her wounds open up around the 3rd day post-op   Migraines    Vertigo     Past Surgical History:  Procedure Laterality Date   BREAST LUMPECTOMY WITH RADIOFREQUENCY TAG IDENTIFICATION Left 11/14/2020   Procedure: BREAST BIOPSY WITH RADIOFREQUENCY TAG IDENTIFICATION;  Surgeon: Lucretia Roers, MD;  Location: [facility] ORS;  Service: General;  Laterality: Left;   BREAST REDUCTION SURGERY Bilateral    COLONOSCOPY WITH PROPOFOL N/A 08/31/2020   Procedure: COLONOSCOPY WITH PROPOFOL;  Surgeon: Lanelle Bal, DO;  Location: [facility] ENDO SUITE;  Service: Endoscopy;  Laterality: N/A;  ASA I / II / PM procedure   MOUTH SURGERY     OTHER SURGICAL HISTORY     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   Subjective Assessment - 11/21/20 1646     Subjective  S: I've been trying to do the exercises and I think it's improving.    Currently in  Pain? No/denies                [facility] OT Assessment - 11/21/20 1646       Assessment   Medical Diagnosis right wrist pain      Precautions   Precautions None                      OT Treatments/Exercises (OP) - 11/21/20 1651       Exercises   Exercises Wrist;Hand      Wrist Exercises   Wrist Flexion PROM;5 reps;Strengthening;10 reps    Bar Weights/Barbell (Wrist Flexion) 1 lb    Wrist Extension PROM;5 reps;Strengthening;10 reps    Bar Weights/Barbell (Wrist Extension) 1 lb    Wrist Radial Deviation PROM;5 reps;Strengthening;10 reps    Bar Weights/Barbell (Radial Deviation) 1 lb    Wrist Ulnar Deviation PROM;5 reps;Strengthening;10 reps    Bar Weights/Barbell (Ulnar Deviation) 1 lb    Other wrist exercises forearm supination/pronation, P/ROM 5X, strengthening 10X      Additional Wrist Exercises   Sponges Pt using green clothespin and 3 point pinch to stack 4 piles of 5 sponges, transitioned to lateral pinch to replace all 20 sponges into bucket  Hand Gripper with Large Beads all beads gripper at 50#, vertical    Hand Gripper with Medium Beads all beads gripper at 50#, vertical      Manual Therapy   Manual Therapy Soft tissue mobilization    Manual therapy comments completed separately from therapeutic exercises    Soft tissue mobilization myofascial release to right wrist and dorsal forearm to address pain and fascial restrictions and increase joint ROM                      OT Short Term Goals - 11/21/20 1710       OT SHORT TERM GOAL #1   Title Pt will be provided with and educated on HEP to improve mobility in wrist required for ADLs.    Time 3    Period Weeks    Status On-going    Target Date 12/09/20      OT SHORT TERM GOAL #2   Title Pt will decrease pain in right wrist to 3/10 or less improve ability to perform housekeeping tasks with less pain and compensatory motions.    Time 3    Period Weeks    Status On-going      OT  SHORT TERM GOAL #3   Title Pt will decrease fascial restrictions in right wrist to min amounts or less to improve mobility required for reaching tasks.    Time 3    Period Weeks    Status On-going      OT SHORT TERM GOAL #4   Title Pt will increase right pinch strength by 3# to improve ability to perform writing tasks, maintaining grip on utensils.    Time 3    Period Weeks    Status On-going      OT SHORT TERM GOAL #5   Title Pt will improve A/ROM of right wrist by 10 degrees to improve mobility required for tasks when working on her house.    Time 3    Period Weeks    Status On-going                      Plan - 11/21/20 1710     Clinical Impression Statement A: Pt reports she feels like her wrist pain is improving, she was able to clean the sink and has not had as much clicking sound in her wrist since the last visit. Myofascial release completed to right wrist to address fascial restrictions, passive stretching completed with pt achieving ROM WNL. Initiated strengthening using 2# handweight, grip strengthening with hand gripper, and pinch tasks.    Body Structure / Function / Physical Skills ADL;Endurance;UE functional use;Fascial restriction;Pain;ROM;Sensation;IADL;Strength;Edema    Plan P: Continue with wrist strengthening-trial theraband    OT Home Exercise Plan eval: Wrist A/ROM; 7/13: add weight to A/ROM for strengthening work    [employer] and Agree with Plan of Care Patient             Patient will benefit from skilled therapeutic intervention in order to improve the following deficits and impairments:   Body Structure / Function / Physical Skills: ADL, Endurance, UE functional use, Fascial restriction, Pain, ROM, Sensation, IADL, Strength, Edema       Visit Diagnosis: Pain in right wrist  Other symptoms and signs involving the musculoskeletal system    Problem List Patient Active Problem List   Diagnosis Date Noted   Complex sclerosing lesion of  left breast 10/11/2020   Left ankle pain 05/30/2020  Migraine 05/30/2020   Encounter to establish care 05/30/2020   PVC's (premature ventricular contractions) 03/27/2020   Constipation 03/27/2020    Ezra Sites, OTR/L  [phone] 11/21/2020, 5:43 PM  [facility] [address] [place], [place], [address] Phone: [phone]   Fax:  [phone]  Name: Amber Simpson MRN: [id number] Date of Birth: 11-Jun-1965

## 2020-11-22 ENCOUNTER — Ambulatory Visit (INDEPENDENT_AMBULATORY_CARE_PROVIDER_SITE_OTHER): Payer: 59 | Admitting: General Surgery

## 2020-11-22 ENCOUNTER — Encounter: Payer: Self-pay | Admitting: General Surgery

## 2020-11-22 VITALS — BP 103/73 | HR 80 | Temp 98.8°F | Resp 16 | Ht 64.0 in | Wt 142.0 lb

## 2020-11-22 DIAGNOSIS — N6489 Other specified disorders of breast: Secondary | ICD-10-CM

## 2020-11-22 NOTE — Patient Instructions (Signed)
Steri strips will peel up in the next 5- 7 days. Ok to remove them at that time. Ok to shower. Call with questions or concerns. Get annual mammogram.  Ok to use scar ointments after strips fall off.

## 2020-11-23 ENCOUNTER — Other Ambulatory Visit: Payer: 59

## 2020-11-23 NOTE — Progress Notes (Signed)
[facility]  Doing well. Having some minor tenderness/ numbness on breast. Had some swelling./ bruising. Sutures had not drained.  BP 103/73   Pulse 80   Temp 98.8 F (37.1 C) (Other (Comment))   Resp 16   Ht 5\' 4"  (1.626 m)   Wt 142 lb (64.4 kg)   SpO2 98%   BMI 24.37 kg/m  Left breast incision c/d/I with sutures, sutures removed and steristrips placed, no erythema or drainage, everted edges   Pathology all benign.  Amber Simpson is a 55 yo with a complex sclerosing lesion of the left breast. Steri strips will peel up in the next 5- 7 days. Ok to remove them at that time. Ok to shower. Call with questions or concerns. Get annual mammogram.  Ok to use scar ointments after strips fall off.  40, MD [facility] [address] [place], [place] [phone] (office)

## 2020-11-27 ENCOUNTER — Ambulatory Visit (INDEPENDENT_AMBULATORY_CARE_PROVIDER_SITE_OTHER): Payer: 59 | Admitting: Podiatry

## 2020-11-27 ENCOUNTER — Other Ambulatory Visit: Payer: Self-pay

## 2020-11-27 ENCOUNTER — Ambulatory Visit (HOSPITAL_COMMUNITY): Payer: Self-pay | Admitting: Occupational Therapy

## 2020-11-27 ENCOUNTER — Encounter (HOSPITAL_COMMUNITY): Payer: Self-pay | Admitting: Occupational Therapy

## 2020-11-27 DIAGNOSIS — R29898 Other symptoms and signs involving the musculoskeletal system: Secondary | ICD-10-CM

## 2020-11-27 DIAGNOSIS — Z5329 Procedure and treatment not carried out because of patient's decision for other reasons: Secondary | ICD-10-CM

## 2020-11-27 DIAGNOSIS — M25531 Pain in right wrist: Secondary | ICD-10-CM

## 2020-11-27 DIAGNOSIS — R600 Localized edema: Secondary | ICD-10-CM

## 2020-11-27 NOTE — Patient Instructions (Signed)
Red Theraband Strengthening: 10X each, 2X/day  1) Wrist extension: -Fix the band firmly under your foot and hold the other end in your affected hand/arm.  -Place your elbow over your knee or hanging off a table, and let your affected wrist hang towards the floor, palm down.  -Pull back into wrist extension.      2) Wrist Flexion -Begin sitting in a chair with your elbows resting on your knees, and a resistance band looped around your hand and anchored under your foot. [facility] your wrist up against the resistance, then lower it back down and repeat.      3) Wrist Supination -Begin sitting with your forearm resting on your thigh, holding one end of a resistance band that is anchored under your foot. Your palm should be facing down with the band running between your thumb and index finger. -Slowly rotate your wrist so your palm faces upward, then rotate it back to the starting position and repeat.  To    4) Wrist Pronation -Begin sitting with your forearm resting on your thigh, holding one end of a resistance band that is anchored under your foot. Your palm should be facing up with the band running between your thumb and index finger. -Slowly rotate your wrist so your palm faces down, then rotate it back to the starting position and repeat.    To     5) Wrist Radial Deviation -Begin sitting upright in a chair with your hand hanging off the edge of a table, palm facing inward, holding one end of a resistance band anchored around your feet. [facility] your wrist upward against the resistance, then lower it back down and repeat.    To

## 2020-11-27 NOTE — Therapy (Signed)
[facility] [address] [place], [place], [address] Phone: [phone]   Fax:  [phone]  Occupational Therapy Treatment  Patient Details  Name: Amber Simpson MRN: [id number] Date of Birth: Jun 17, 1965 Referring Provider (OT): Dr. Darreld Mclean   Encounter Date: 11/27/2020   OT End of Session - 11/27/20 1636     Visit Number 3    Number of Visits 6    Date for OT Re-Evaluation 12/09/20    Authorization Type [employer], $5 copay    Authorization Time Period 30 visit limit, 0 used at evaluation    Authorization - Visit Number 3    Authorization - Number of Visits 30    OT Start Time 1602    OT Stop Time 1640    OT Time Calculation (min) 38 min    Activity Tolerance Patient tolerated treatment well    Behavior During Therapy [facility] for tasks assessed/performed             Past Medical History:  Diagnosis Date   Allergy    sutures- she states she her wounds open up around the 3rd day post-op   Migraines    Vertigo     Past Surgical History:  Procedure Laterality Date   BREAST LUMPECTOMY WITH RADIOFREQUENCY TAG IDENTIFICATION Left 11/14/2020   Procedure: BREAST BIOPSY WITH RADIOFREQUENCY TAG IDENTIFICATION;  Surgeon: Lucretia Roers, MD;  Location: [facility];  Service: General;  Laterality: Left;   BREAST REDUCTION SURGERY Bilateral    COLONOSCOPY WITH PROPOFOL N/A 08/31/2020   Procedure: COLONOSCOPY WITH PROPOFOL;  Surgeon: Lanelle Bal, DO;  Location: [facility];  Service: Endoscopy;  Laterality: N/A;  ASA I / II / PM procedure   MOUTH SURGERY     OTHER SURGICAL HISTORY     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   Subjective Assessment - 11/27/20 1602     Subjective  S: I think I'm almost all the way better.    Currently in Pain? No/denies                 [facility] OT Assessment - 11/27/20 1612       Assessment   Medical Diagnosis right wrist pain      Precautions   Precautions None                      OT Treatments/Exercises (OP) - 11/27/20 1603       Exercises   Exercises Wrist;Hand      Wrist Exercises   Wrist Flexion PROM;5 reps;Theraband;10 reps    Theraband Level (Wrist Flexion) Level 2 (Red)    Wrist Extension PROM;5 reps;Theraband;10 reps    Theraband Level (Wrist Extension) Level 2 (Red)    Wrist Radial Deviation PROM;5 reps;Theraband;10 reps    Theraband Level (Radial Deviation) Level 2 (Red)    Other wrist exercises forearm supination/pronation, P/ROM 5X, red theraband 10X    Other wrist exercises Pt cutting circles into red theraputty using pvc pipe, working on sustained grip and wrist stability      Additional Wrist Exercises   Hand Gripper with Large Beads all beads gripper at 50#, vertical    Hand Gripper with Medium Beads all beads gripper at 50#, vertical  Hand Gripper with Small Beads all beads gripper at 50%, vertical      Manual Therapy   Manual Therapy Soft tissue mobilization    Manual therapy comments completed separately from therapeutic exercises    Soft tissue mobilization myofascial release to right wrist and dorsal forearm to address pain and fascial restrictions and increase joint ROM                    OT Education - 11/27/20 1626     Education Details wrist strengthening-red theraband    Person(s) Educated Patient    Methods Explanation;Demonstration;Handout    Comprehension Verbalized understanding;Returned demonstration              OT Short Term Goals - 11/21/20 1710       OT SHORT TERM GOAL #1   Title Pt will be provided with and educated on HEP to improve mobility in wrist required for ADLs.    Time 3    Period Weeks    Status On-going    Target Date 12/09/20      OT SHORT TERM GOAL #2   Title Pt will decrease pain in right wrist to  3/10 or less improve ability to perform housekeeping tasks with less pain and compensatory motions.    Time 3    Period Weeks    Status On-going      OT SHORT TERM GOAL #3   Title Pt will decrease fascial restrictions in right wrist to min amounts or less to improve mobility required for reaching tasks.    Time 3    Period Weeks    Status On-going      OT SHORT TERM GOAL #4   Title Pt will increase right pinch strength by 3# to improve ability to perform writing tasks, maintaining grip on utensils.    Time 3    Period Weeks    Status On-going      OT SHORT TERM GOAL #5   Title Pt will improve A/ROM of right wrist by 10 degrees to improve mobility required for tasks when working on her house.    Time 3    Period Weeks    Status On-going                      Plan - 11/27/20 1632     Clinical Impression Statement A: Pt reports decreased clicking in her wrist this past week, only hears it occasionally at end ranges of wrist mobility. Continued with manual techniques to address fascial restrictions. Pt with full ROM on passive stretching, progressed to strengthening using red theraband today. Pt with good form, verbal cuing for technique and wrist position. Added small beads with gripper, pt with fatigue after grip strengthening and soreness along volar forearm. Added red theraputty tasks for grip and wrist strengthening.    Body Structure / Function / Physical Skills ADL;Endurance;UE functional use;Fascial restriction;Pain;ROM;Sensation;IADL;Strength;Edema    Plan P: Follow up on HEP, continue with theraputty work adding pinch tasks    OT Home Exercise Plan eval: Wrist A/ROM; 7/13: add weight to A/ROM for strengthening work; 7/19: red theraband wrist strengthening    Consulted and Agree with Plan of Care Patient             Patient will benefit from skilled therapeutic intervention in order to improve the following deficits and impairments:   Body Structure / Function  / Physical Skills: ADL, Endurance, UE functional use, Fascial restriction, Pain,  ROM, Sensation, IADL, Strength, Edema       Visit Diagnosis: Pain in right wrist  Other symptoms and signs involving the musculoskeletal system  Localized edema    Problem List 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

## 2020-11-30 ENCOUNTER — Other Ambulatory Visit: Payer: Self-pay

## 2020-11-30 ENCOUNTER — Encounter (HOSPITAL_COMMUNITY): Payer: Self-pay | Admitting: Occupational Therapy

## 2020-11-30 ENCOUNTER — Ambulatory Visit (HOSPITAL_COMMUNITY): Payer: Self-pay | Admitting: Occupational Therapy

## 2020-11-30 DIAGNOSIS — M25531 Pain in right wrist: Secondary | ICD-10-CM

## 2020-11-30 DIAGNOSIS — R29898 Other symptoms and signs involving the musculoskeletal system: Secondary | ICD-10-CM

## 2020-11-30 NOTE — Therapy (Signed)
[facility] [address] [place], [place], [address] Phone: [phone]   Fax:  [phone]  Occupational Therapy Reassessment, Treatment, Discharge Summary  Patient Details  Name: Amber Simpson MRN: [id number] Date of Birth: [birth year] Referring Provider (OT): Dr. Sanjuana Kava   Encounter Date: 11/30/2020   OT End of Session - 11/30/20 1141     Visit Number 4    Number of Visits 6    Date for OT Re-Evaluation 12/09/20    Authorization Type [employer], $5 copay    Authorization Time Period 30 visit limit, 0 used at evaluation    Authorization - Visit Number 4    Authorization - Number of Visits 30    OT Start Time 1115    OT Stop Time 1143    OT Time Calculation (min) 28 min    Activity Tolerance Patient tolerated treatment well    Behavior During Therapy [facility] for tasks assessed/performed             Past Medical History:  Diagnosis Date   Allergy    sutures- she states she her wounds open up around the 3rd day post-op   Migraines    Vertigo     Past Surgical History:  Procedure Laterality Date   BREAST LUMPECTOMY WITH RADIOFREQUENCY TAG IDENTIFICATION Left 01/15/4382   Procedure: BREAST BIOPSY WITH RADIOFREQUENCY TAG IDENTIFICATION;  Surgeon: Virl Cagey, MD;  Location: [facility];  Service: General;  Laterality: Left;   BREAST REDUCTION SURGERY Bilateral    COLONOSCOPY WITH PROPOFOL N/A 08/31/2020   Procedure: COLONOSCOPY WITH PROPOFOL;  Surgeon: Eloise Harman, DO;  Location: [facility];  Service: Endoscopy;  Laterality: N/A;  ASA I / II / PM procedure   MOUTH SURGERY     OTHER SURGICAL HISTORY     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   Subjective Assessment - 11/30/20 1116     Subjective  S: I cleaned the sink without pain!    Currently in  Pain? No/denies                [facility] OT Assessment - 11/30/20 1116       Assessment   Medical Diagnosis right wrist pain      Precautions   Precautions None      Observation/Other Assessments   Quick DASH  2.27   38.64 previous     AROM   Right/Left Wrist Right    Right Wrist Extension 70 Degrees   50 previous   Right Wrist Flexion 70 Degrees   58 previous     Strength   Strength Assessment Site Wrist;Hand;Forearm    Right/Left Forearm Right    Right Forearm Pronation 5/5   same as previous   Right Forearm Supination 5/5   4+/5 previous   Right/Left Wrist Right    Right Wrist Flexion 5/5   4+/5 previous   Right Wrist Extension 5/5   same as previous   Right Wrist Radial Deviation 5/5   4+/5 previous   Right Wrist Ulnar Deviation 5/5   same as previous   Right/Left hand Right    Right Hand Grip (lbs) 85   65 previous   Right Hand Lateral Pinch 23 lbs   16 previous  Right Hand 3 Point Pinch 16 lbs   10 previous               Katina Dung - 11/30/20 1126     Open a tight or new jar No difficulty    Do heavy household chores (wash walls, wash floors) No difficulty    Carry a shopping bag or briefcase No difficulty    Wash your back No difficulty    Use a knife to cut food No difficulty    Recreational activities in which you take some force or impact through your arm, shoulder, or hand (golf, hammering, tennis) No difficulty    During the past week, to what extent has your arm, shoulder or hand problem interfered with your normal social activities with family, friends, neighbors, or groups? Not at all    During the past week, to what extent has your arm, shoulder or hand problem limited your work or other regular daily activities Not at all    Arm, shoulder, or hand pain. Mild    Tingling (pins and needles) in your arm, shoulder, or hand None    Difficulty Sleeping No difficulty    DASH Score 2.27 %                  OT Treatments/Exercises (OP) -  11/30/20 1117       Exercises   Exercises Wrist;Hand      Wrist Exercises   Wrist Flexion Strengthening;15 reps    Bar Weights/Barbell (Wrist Flexion) 3 lbs    Wrist Extension Strengthening;15 reps    Bar Weights/Barbell (Wrist Extension) 3 lbs    Wrist Radial Deviation Strengthening;15 reps    Bar Weights/Barbell (Radial Deviation) 3 lbs    Wrist Ulnar Deviation Strengthening;15 reps    Bar Weights/Barbell (Ulnar Deviation) 3 lbs      Additional Wrist Exercises   Hand Gripper with Medium Beads all beads gripper at 50#, vertical    Hand Gripper with Small Beads all beads gripper at 50%, vertical                      OT Short Term Goals - 11/30/20 1129       OT SHORT TERM GOAL #1   Title Pt will be provided with and educated on HEP to improve mobility in wrist required for ADLs.    Time 3    Period Weeks    Status Achieved    Target Date 12/09/20      OT SHORT TERM GOAL #2   Title Pt will decrease pain in right wrist to 3/10 or less improve ability to perform housekeeping tasks with less pain and compensatory motions.    Time 3    Period Weeks    Status Achieved      OT SHORT TERM GOAL #3   Title Pt will decrease fascial restrictions in right wrist to min amounts or less to improve mobility required for reaching tasks.    Time 3    Period Weeks    Status Achieved      OT SHORT TERM GOAL #4   Title Pt will increase right pinch strength by 3# to improve ability to perform writing tasks, maintaining grip on utensils.    Time 3    Period Weeks    Status Achieved      OT SHORT TERM GOAL #5   Title Pt will improve A/ROM of right wrist by 10 degrees to improve mobility  required for tasks when working on her house.    Time 3    Period Weeks    Status Achieved                      Plan - 11/30/20 1131     Clinical Impression Statement A: Pt reports she was able to clean her sink, use tools, and lift and carry boards. She did feel a twinge of  pain when lifting a 2x4 but it resolved quickly. Reassessment completed this session, pt has met all goals and demonstrates improvements in ROM, strength, and grip/pinch strength of RUE. Continued with wrist and grip strengthening this session, reviewed HEP. Pt agreeable to discharge today.    Body Structure / Function / Physical Skills ADL;Endurance;UE functional use;Fascial restriction;Pain;ROM;Sensation;IADL;Strength;Edema    Plan P: Discharge pt    OT Home Exercise Plan eval: Wrist A/ROM; 7/13: add weight to A/ROM for strengthening work; 7/19: red theraband wrist strengthening    Consulted and Agree with Plan of Care Patient             Patient will benefit from skilled therapeutic intervention in order to improve the following deficits and impairments:   Body Structure / Function / Physical Skills: ADL, Endurance, UE functional use, Fascial restriction, Pain, ROM, Sensation, IADL, Strength, Edema       Visit Diagnosis: Pain in right wrist  Other symptoms and signs involving the musculoskeletal system    Problem List 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   Left ankle pain 05/30/2020   Migraine 05/30/2020   Encounter to establish care 05/30/2020   PVC's (premature ventricular contractions) 03/27/2020   Constipation 03/27/2020    Guadelupe Sabin, OTR/L  [phone] 11/30/2020, 11:44 AM  [place], [place], [address] Phone: [phone]   Fax:  [phone]  Name: Amber Simpson MRN: [id number] Date of Birth: 06-24-1965  OCCUPATIONAL THERAPY DISCHARGE SUMMARY  Visits from Start of Care: 4  Current functional level related to goals / functional outcomes: See above. Pt has met all goals, has right wrist ROM and strength WNL, grip and pinch strength are both WNL and pt is using RUE as dominant during all ADLs and work tasks.     Remaining deficits: Occasional pain with heavy lifting   Education / Equipment: HEP for wrist ROM and strengthening   Patient agrees to discharge. Patient goals were met. Patient is being discharged due to meeting the stated rehab goals.Marland Kitchen

## 2020-12-03 ENCOUNTER — Ambulatory Visit (HOSPITAL_COMMUNITY): Payer: Self-pay

## 2020-12-05 ENCOUNTER — Ambulatory Visit (HOSPITAL_COMMUNITY): Payer: Self-pay

## 2021-03-20 ENCOUNTER — Ambulatory Visit: Payer: 59 | Admitting: Gastroenterology

## 2021-04-03 ENCOUNTER — Encounter: Payer: 59 | Admitting: Nurse Practitioner

## 2021-11-25 ENCOUNTER — Other Ambulatory Visit: Payer: Self-pay | Admitting: Physician Assistant

## 2021-11-25 DIAGNOSIS — Z1231 Encounter for screening mammogram for malignant neoplasm of breast: Secondary | ICD-10-CM

## 2021-12-05 ENCOUNTER — Ambulatory Visit: Payer: Self-pay

## 2021-12-19 ENCOUNTER — Other Ambulatory Visit (HOSPITAL_COMMUNITY): Payer: Self-pay | Admitting: Physician Assistant

## 2021-12-19 DIAGNOSIS — Z1231 Encounter for screening mammogram for malignant neoplasm of breast: Secondary | ICD-10-CM

## 2021-12-23 ENCOUNTER — Inpatient Hospital Stay (HOSPITAL_COMMUNITY): Admission: RE | Admit: 2021-12-23 | Payer: Self-pay | Source: Ambulatory Visit

## 2021-12-23 DIAGNOSIS — Z1231 Encounter for screening mammogram for malignant neoplasm of breast: Secondary | ICD-10-CM

## 2022-05-30 IMAGING — MG MM PLC BREAST LOC DEV 1ST LESION INC MAMMO GUIDE*L*
8 series · 8 of 8 positions shown · non-contrast
Comparison: Previous exam(s)

CLINICAL DATA: Pre surgical excision localization of a recently
diagnosed complex sclerosing lesion in the upper inner quadrant of
the left breast.

EXAM:
MAMMOGRAPHIC GUIDED RADIOFREQUENCY DEVICE
LOCALIZATION OF THE LEFT BREAST

[L ML (1 of 5)]
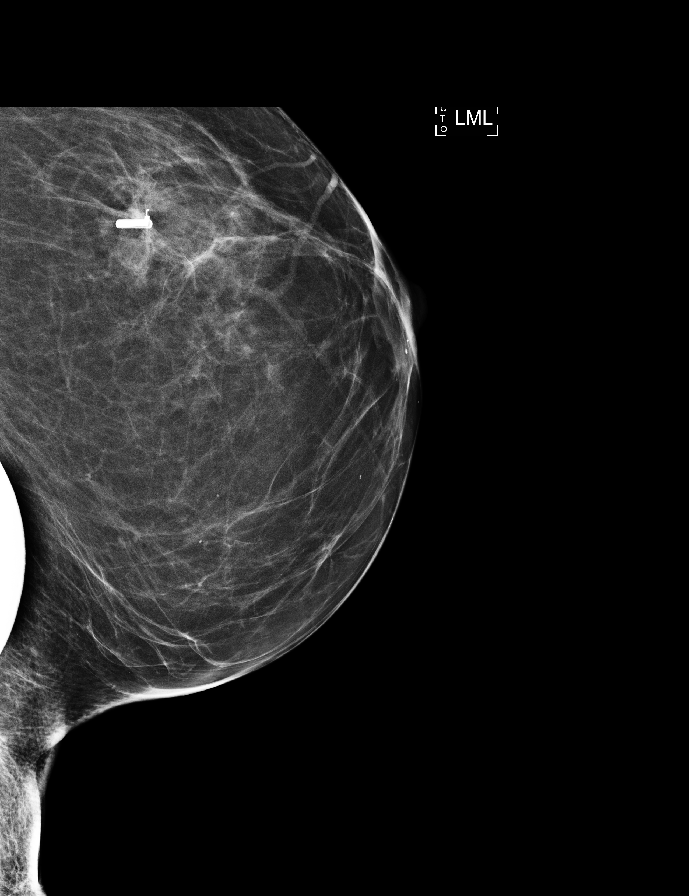

[L CC (1 of 3)]
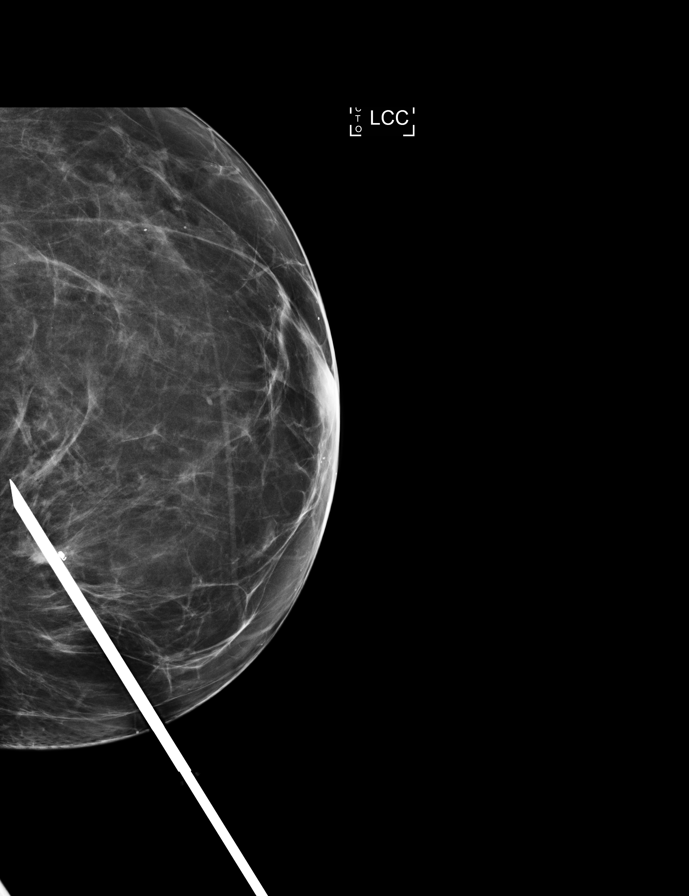

[L CC (2 of 3)]
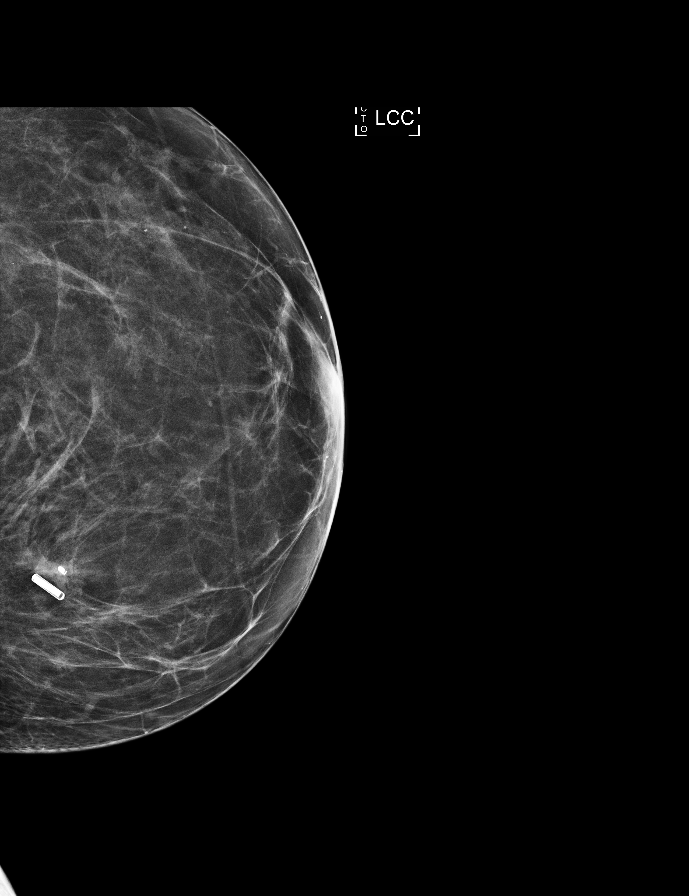

[L ML (2 of 5)]
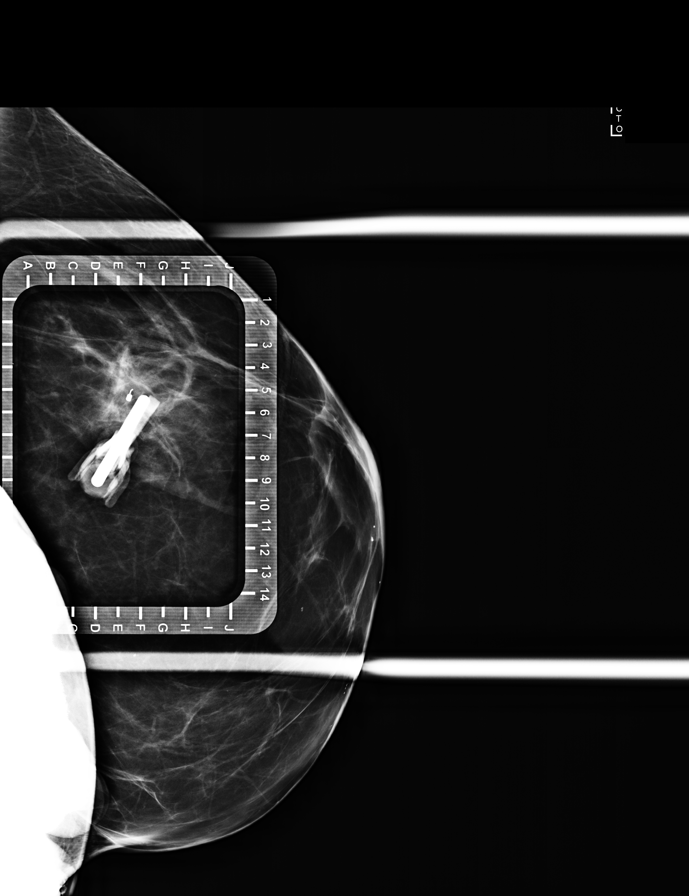

[L ML (3 of 5)]
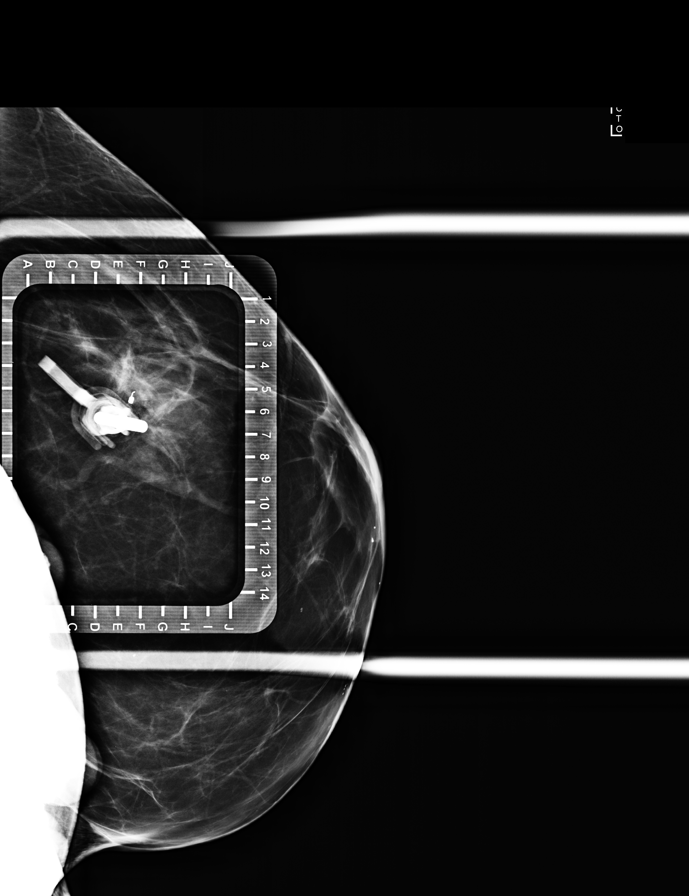

[L CC (3 of 3)]
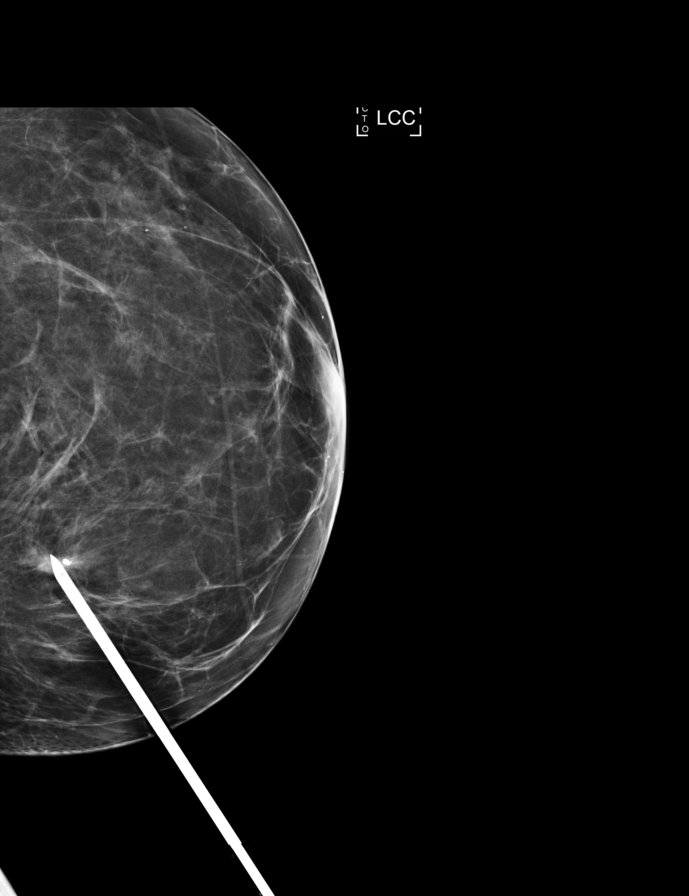

[L ML (4 of 5)]
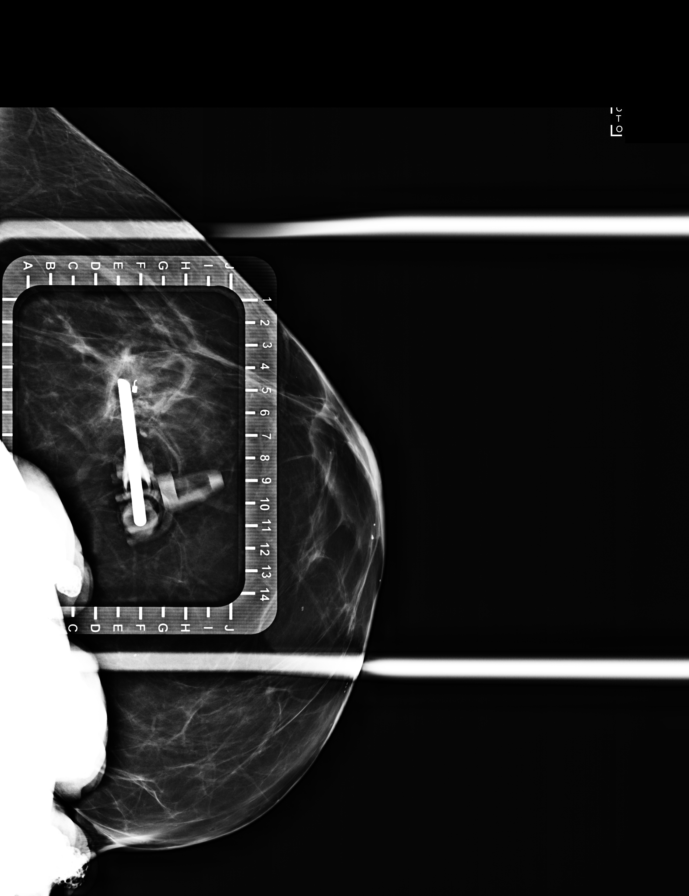

[L ML (5 of 5)]
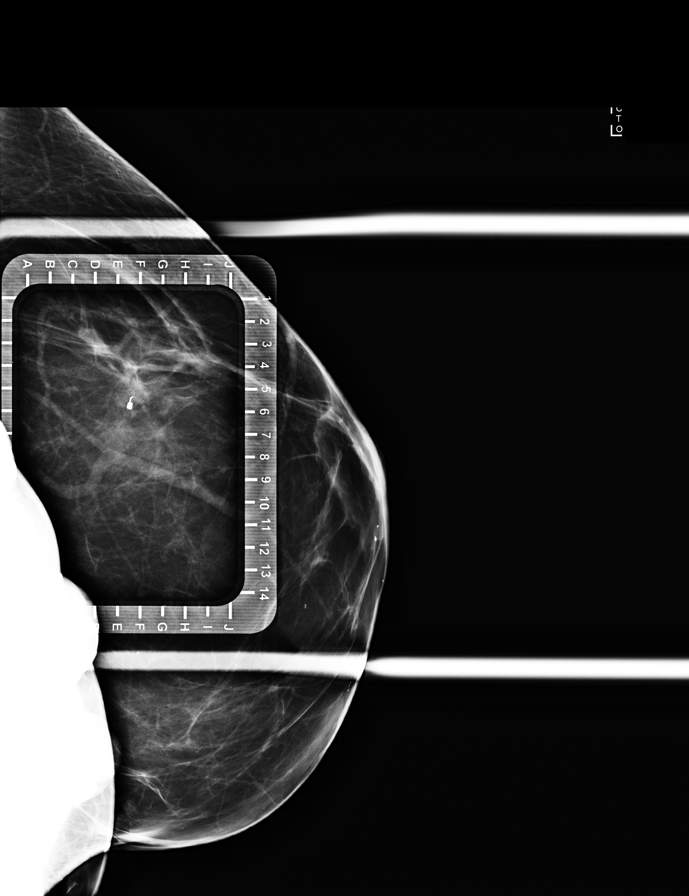

[8 of 8 positions shown; findings below may reference images not displayed]

FINDINGS: Patient presents for radiofrequency device localization prior to
left breast excision. I met with the patient and we discussed the
procedure of radiofrequency device localization including benefits
and alternatives. We discussed the high likelihood of a successful
procedure. We discussed the risks of the procedure including
infection, bleeding, tissue injury and further surgery. Informed,
written consent was given.

The usual time-out protocol was performed immediately prior to the
procedure.

Using mammographic guidance, sterile technique, 1% lidocaine as
local anesthesia, a radiofrequency tag was used to localize the
recently placed coil shaped biopsy marker clip in the upper inner
quadrant of the left breast using a medial approach.

The follow-up mammogram images confirm that the RF device is in the
expected location and are marked for Dr. Ebiloma.

Follow-up survey of the patient confirms the presence of the RF
device.

The patient tolerated the procedure well and was released from the
[REDACTED].
IMPRESSION: Radiofrequency device localization of the left breast. No apparent
complications.

## 2022-05-31 IMAGING — MG MM BREAST SURGICAL SPECIMEN
1 series · 1 of 1 positions shown · non-contrast
Comparison: Previous exam(s).

CLINICAL DATA: Status post RF tag localized LEFT breast lumpectomy.

EXAM:
SPECIMEN RADIOGRAPH OF THE LEFT BREAST

[L SPECIMEN]
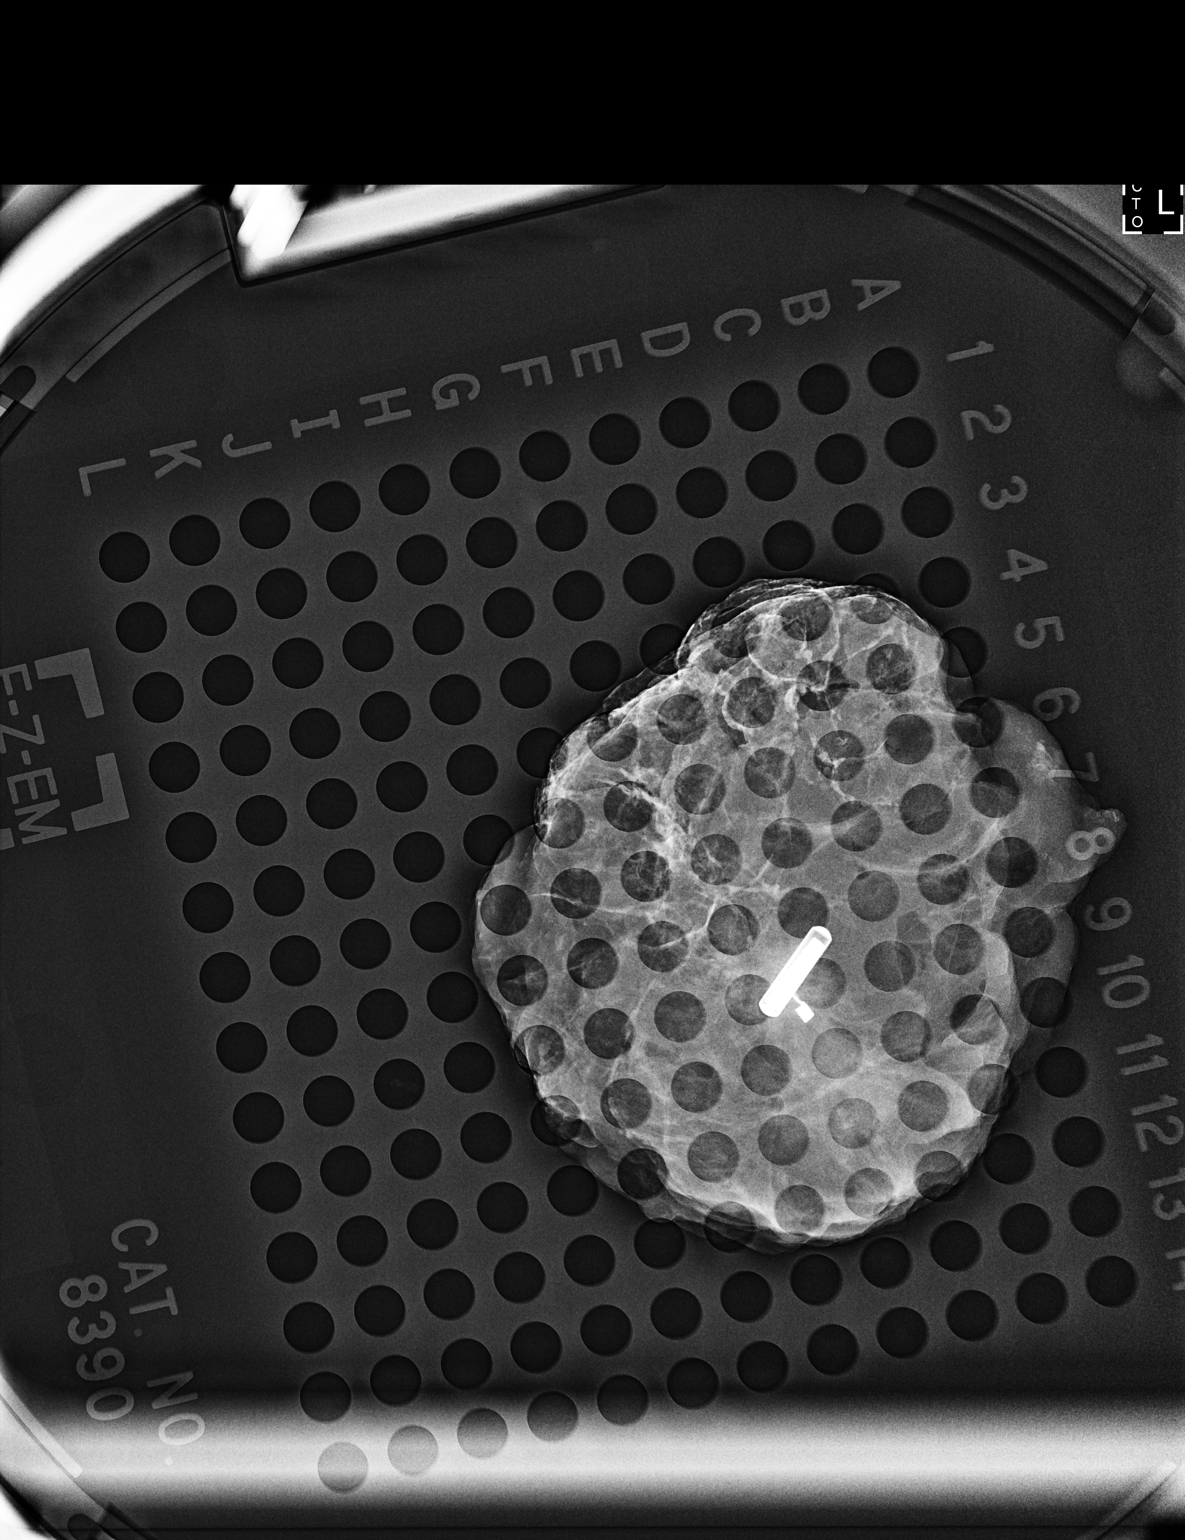

[1 of 1 positions shown; findings below may reference images not displayed]

FINDINGS: Status post excision of the LEFT breast. The RF tag and COIL shaped
clip are present within the specimen.
IMPRESSION: Specimen radiograph of the LEFT breast.

## 2022-07-10 ENCOUNTER — Encounter: Payer: Self-pay | Admitting: Radiology

## 2022-12-11 ENCOUNTER — Emergency Department (HOSPITAL_COMMUNITY): Payer: PRIVATE HEALTH INSURANCE

## 2022-12-11 ENCOUNTER — Other Ambulatory Visit: Payer: Self-pay

## 2022-12-11 ENCOUNTER — Encounter (HOSPITAL_COMMUNITY): Payer: Self-pay

## 2022-12-11 ENCOUNTER — Emergency Department (HOSPITAL_COMMUNITY)
Admission: EM | Admit: 2022-12-11 | Discharge: 2022-12-11 | Disposition: A | Discharge status: Home / Self Care | Payer: PRIVATE HEALTH INSURANCE | Attending: Student | Admitting: Student

## 2022-12-11 DIAGNOSIS — R109 Unspecified abdominal pain: Secondary | ICD-10-CM | POA: Diagnosis present

## 2022-12-11 DIAGNOSIS — R0600 Dyspnea, unspecified: Secondary | ICD-10-CM

## 2022-12-11 DIAGNOSIS — R112 Nausea with vomiting, unspecified: Secondary | ICD-10-CM | POA: Diagnosis not present

## 2022-12-11 DIAGNOSIS — N3000 Acute cystitis without hematuria: Secondary | ICD-10-CM | POA: Insufficient documentation

## 2022-12-11 DIAGNOSIS — R600 Localized edema: Secondary | ICD-10-CM | POA: Diagnosis not present

## 2022-12-11 DIAGNOSIS — Z87891 Personal history of nicotine dependence: Secondary | ICD-10-CM | POA: Insufficient documentation

## 2022-12-11 DIAGNOSIS — R5383 Other fatigue: Secondary | ICD-10-CM | POA: Diagnosis not present

## 2022-12-11 DIAGNOSIS — R0602 Shortness of breath: Secondary | ICD-10-CM | POA: Insufficient documentation

## 2022-12-11 LAB — CBC WITH DIFFERENTIAL/PLATELET
Abs Immature Granulocytes: 0.02 10*3/uL (ref 0.00–0.07)
Basophils Absolute: 0 10*3/uL (ref 0.0–0.1)
Basophils Relative: 0 %
Eosinophils Absolute: 0 10*3/uL (ref 0.0–0.5)
Eosinophils Relative: 1 %
HCT: 41.9 % (ref 36.0–46.0)
Hemoglobin: 14.1 g/dL (ref 12.0–15.0)
Immature Granulocytes: 0 %
Lymphocytes Relative: 32 %
Lymphs Abs: 2 10*3/uL (ref 0.7–4.0)
MCH: 28.5 pg (ref 26.0–34.0)
MCHC: 33.7 g/dL (ref 30.0–36.0)
MCV: 84.8 fL (ref 80.0–100.0)
Monocytes Absolute: 0.6 10*3/uL (ref 0.1–1.0)
Monocytes Relative: 10 %
Neutro Abs: 3.6 10*3/uL (ref 1.7–7.7)
Neutrophils Relative %: 57 %
Platelets: 281 10*3/uL (ref 150–400)
RBC: 4.94 MIL/uL (ref 3.87–5.11)
RDW: 12.8 % (ref 11.5–15.5)
WBC: 6.3 10*3/uL (ref 4.0–10.5)
nRBC: 0 % (ref 0.0–0.2)

## 2022-12-11 LAB — URINALYSIS, ROUTINE W REFLEX MICROSCOPIC
Bacteria, UA: NONE SEEN
Bilirubin Urine: NEGATIVE
Glucose, UA: NEGATIVE mg/dL
Hgb urine dipstick: NEGATIVE
Ketones, ur: NEGATIVE mg/dL
Nitrite: NEGATIVE
Protein, ur: NEGATIVE mg/dL
Specific Gravity, Urine: 1.033 — ABNORMAL HIGH (ref 1.005–1.030)
pH: 7 (ref 5.0–8.0)

## 2022-12-11 LAB — COMPREHENSIVE METABOLIC PANEL
ALT: 15 U/L (ref 0–44)
AST: 26 U/L (ref 15–41)
Albumin: 3.9 g/dL (ref 3.5–5.0)
Alkaline Phosphatase: 71 U/L (ref 38–126)
Anion gap: 11 (ref 5–15)
BUN: 17 mg/dL (ref 6–20)
CO2: 23 mmol/L (ref 22–32)
Calcium: 9 mg/dL (ref 8.9–10.3)
Chloride: 101 mmol/L (ref 98–111)
Creatinine, Ser: 0.8 mg/dL (ref 0.44–1.00)
GFR, Estimated: >60 mL/min (ref >60)
Glucose, Bld: 141 mg/dL — ABNORMAL HIGH (ref 70–99)
Potassium: 3.4 mmol/L — ABNORMAL LOW (ref 3.5–5.1)
Sodium: 135 mmol/L (ref 135–145)
Total Bilirubin: 0.3 mg/dL (ref 0.3–1.2)
Total Protein: 6.8 g/dL (ref 6.5–8.1)

## 2022-12-11 LAB — LIPASE, BLOOD: Lipase: 36 U/L (ref 11–51)

## 2022-12-11 LAB — BRAIN NATRIURETIC PEPTIDE: B Natriuretic Peptide: 7 pg/mL (ref 0.0–100.0)

## 2022-12-11 LAB — TROPONIN I (HIGH SENSITIVITY): Troponin I (High Sensitivity): <2 ng/L (ref <18)

## 2022-12-11 MED ORDER — NAPROXEN 375 MG PO TABS: 375.0000 mg | ORAL_TABLET | Freq: Two times a day (BID) | ORAL | 0 refills | Status: DC

## 2022-12-11 MED ORDER — ONDANSETRON 4 MG PO TBDP: 4.0000 mg | ORAL_TABLET | Freq: Three times a day (TID) | ORAL | 0 refills | Status: DC | PRN

## 2022-12-11 MED ORDER — CEFADROXIL 500 MG PO CAPS: 500.0000 mg | ORAL_CAPSULE | Freq: Two times a day (BID) | ORAL | 0 refills | Status: AC

## 2022-12-11 MED ORDER — IOHEXOL 300 MG/ML  SOLN
100.0000 mL | Freq: Once | INTRAMUSCULAR | Status: AC | PRN
  Administered 2022-12-11: 100 mL via INTRAVENOUS

## 2022-12-11 MED ORDER — LIDOCAINE 5 % EX PTCH
1.0000 | MEDICATED_PATCH | CUTANEOUS | Status: DC
  Administered 2022-12-11: 1 via TRANSDERMAL
  Filled 2022-12-11: qty 1

## 2022-12-11 MED ORDER — DICYCLOMINE HCL 10 MG PO CAPS
20.0000 mg | ORAL_CAPSULE | Freq: Once | ORAL | Status: AC
  Administered 2022-12-11: 20 mg via ORAL
  Filled 2022-12-11: qty 2

## 2022-12-11 MED ORDER — ONDANSETRON HCL 4 MG PO TABS: 4.0000 mg | ORAL_TABLET | Freq: Three times a day (TID) | ORAL | Status: DC | PRN

## 2022-12-11 MED ORDER — DICYCLOMINE HCL 20 MG PO TABS: 20.0000 mg | ORAL_TABLET | Freq: Two times a day (BID) | ORAL | 0 refills | Status: DC

## 2022-12-11 MED ORDER — LIDOCAINE VISCOUS HCL 2 % MT SOLN
15.0000 mL | Freq: Once | OROMUCOSAL | Status: AC
  Administered 2022-12-11: 15 mL via ORAL
  Filled 2022-12-11: qty 15

## 2022-12-11 MED ORDER — KETOROLAC TROMETHAMINE 15 MG/ML IJ SOLN
15.0000 mg | Freq: Once | INTRAMUSCULAR | Status: AC
  Administered 2022-12-11: 15 mg via INTRAVENOUS
  Filled 2022-12-11: qty 1

## 2022-12-11 MED ORDER — ONDANSETRON HCL 4 MG/2ML IJ SOLN
4.0000 mg | Freq: Once | INTRAMUSCULAR | Status: AC
  Administered 2022-12-11: 4 mg via INTRAVENOUS
  Filled 2022-12-11: qty 2

## 2022-12-11 MED ORDER — ALUM & MAG HYDROXIDE-SIMETH 200-200-20 MG/5ML PO SUSP
30.0000 mL | Freq: Once | ORAL | Status: AC
  Administered 2022-12-11: 30 mL via ORAL
  Filled 2022-12-11: qty 30

## 2022-12-11 MED ORDER — LACTATED RINGERS IV BOLUS
1000.0000 mL | Freq: Once | INTRAVENOUS | Status: AC
  Administered 2022-12-11: 1000 mL via INTRAVENOUS

## 2022-12-11 NOTE — ED Provider Notes (Signed)
[place] EMERGENCY DEPARTMENT AT [facility] Provider Note  CSN: [id number] Arrival date & time: 12/11/22 1038  Chief Complaint(s) Abdominal Pain and Shortness of Breath  HPI Amber Simpson is a 57 y.o. female who presents emergency room for evaluation of multiple complaints including abdominal pain, nausea, vomiting, fatigue and shortness of breath.  Patient states that over the last week she has had upper respiratory symptoms but for the last 3 days has had persistent abdominal pain and nausea.  This morning she had an episode of persistent vomiting and currently feels very short of breath.  She denies chest pain, headache, documented fever or other systemic symptoms.  She does endorse increased frequency.   Past Medical History Past Medical History:  Diagnosis Date   Migraine    There are no problems to display for this patient.  Home Medication(s) Prior to Admission medications   Medication Sig Start Date End Date Taking? Authorizing Provider  cefadroxil (DURICEF) 500 MG capsule Take 1 capsule (500 mg total) by mouth 2 (two) times daily for 7 days. 12/11/22 12/18/22 Yes Shallyn Constancio, MD  dicyclomine (BENTYL) 20 MG tablet Take 1 tablet (20 mg total) by mouth 2 (two) times daily. 12/11/22  Yes Yessica Putnam, MD  naproxen (NAPROSYN) 375 MG tablet Take 1 tablet (375 mg total) by mouth 2 (two) times daily. 12/11/22  Yes Andersyn Fragoso, MD  ondansetron (ZOFRAN-ODT) 4 MG disintegrating tablet Take 1 tablet (4 mg total) by mouth every 8 (eight) hours as needed for nausea or vomiting. 12/11/22  Yes Tationa Stech, MD                                                                                                                                    Past Surgical History History reviewed. No pertinent surgical history. Family History History reviewed. No pertinent family history.  Social History Social History   Tobacco Use   Smoking status: Former    Current packs/day: 0.00     Types: Cigarettes    Quit date: 12/07/2016    Years since quitting: 6.0   Smokeless tobacco: Never  Substance Use Topics   Alcohol use: No   Drug use: No   Allergies Patient has no known allergies.  Review of Systems Review of Systems  Respiratory:  Positive for shortness of breath.   Gastrointestinal:  Positive for abdominal pain, nausea and vomiting.  Genitourinary:  Positive for urgency.    Physical Exam Vital Signs  I have reviewed the triage vital signs BP 99/60 (BP Location: Left Arm)   Pulse 68   Temp 98.2 F (36.8 C) (Oral)   Resp 17   Ht 5\' 4"  (1.626 m)   Wt 54.4 kg   LMP 01/05/2016   SpO2 98%   BMI 20.60 kg/m   Physical Exam Vitals and nursing note reviewed.  Constitutional:      General: She is not in acute distress.  Appearance: She is well-developed.  HENT:     Head: Normocephalic and atraumatic.  Eyes:     Conjunctiva/sclera: Conjunctivae normal.  Cardiovascular:     Rate and Rhythm: Normal rate and regular rhythm.     Heart sounds: No murmur heard. Pulmonary:     Effort: Pulmonary effort is normal. No respiratory distress.     Breath sounds: Normal breath sounds.  Abdominal:     Palpations: Abdomen is soft.     Tenderness: There is no abdominal tenderness.  Musculoskeletal:        General: No swelling.     Cervical back: Neck supple.  Skin:    General: Skin is warm and dry.     Capillary Refill: Capillary refill takes less than 2 seconds.  Neurological:     Mental Status: She is alert.  Psychiatric:        Mood and Affect: Mood normal.     ED Results and Treatments Labs (all labs ordered are listed, but only abnormal results are displayed) Labs Reviewed  COMPREHENSIVE METABOLIC PANEL - Abnormal; Notable for the following components:      Result Value   Potassium 3.4 (*)    Glucose, Bld 141 (*)    All other components within normal limits  URINALYSIS, ROUTINE W REFLEX MICROSCOPIC - Abnormal; Notable for the following components:    Specific Gravity, Urine 1.033 (*)    Leukocytes,Ua LARGE (*)    All other components within normal limits  URINE CULTURE  CBC WITH DIFFERENTIAL/PLATELET  LIPASE, BLOOD  BRAIN NATRIURETIC PEPTIDE  TROPONIN I (HIGH SENSITIVITY)                                                                                                                          Radiology CT ABDOMEN PELVIS W CONTRAST  Result Date: 12/11/2022 CLINICAL DATA:  Right lower quadrant pain. Nausea and vomiting. Constipation. EXAM: CT ABDOMEN AND PELVIS WITH CONTRAST TECHNIQUE: Multidetector CT imaging of the abdomen and pelvis was performed using the standard protocol following bolus administration of intravenous contrast. RADIATION DOSE REDUCTION: This exam was performed according to the departmental dose-optimization program which includes automated exposure control, adjustment of the mA and/or kV according to patient size and/or use of iterative reconstruction technique. CONTRAST:  OMNIPAQUE IOHEXOL 300 MG/ML  SOLN COMPARISON:  None Available. FINDINGS: Lower Chest: No acute findings. Hepatobiliary: No suspicious hepatic masses identified. Mild-to-moderate diffuse hepatic steatosis noted. Gallbladder is unremarkable. No evidence of biliary ductal dilatation. Pancreas:  No mass or inflammatory changes. Spleen: Within normal limits in size and appearance. Adrenals/Urinary Tract: No suspicious masses identified. No evidence of ureteral calculi or hydronephrosis. Stomach/Bowel: No evidence of obstruction, inflammatory process or abnormal fluid collections. Normal appendix visualized. Vascular/Lymphatic: No pathologically enlarged lymph nodes. No acute vascular findings. Reproductive:  No mass or other significant abnormality. Other:  None. Musculoskeletal:  No suspicious bone lesions identified. IMPRESSION: No evidence of appendicitis or other acute findings. Hepatic steatosis. Electronically Signed  By: Danae Orleans M.D.   On:  12/11/2022 13:34   DG Chest Portable 1 View  Result Date: 12/11/2022 CLINICAL DATA:  dyspnea EXAM: PORTABLE CHEST 1 VIEW COMPARISON:  None Available. FINDINGS: The heart size and mediastinal contours are within normal limits. No pleural effusion. No pneumothorax. There is a nodular density of the left lung base, which likely represents a calcified granuloma. This could be further assessed with a chest CT. The visualized skeletal structures are unremarkable. IMPRESSION: 1. No active cardiopulmonary disease. 2. Nodular density at the left lung base, likely represents a calcified granuloma. This could be further assessed with a chest CT. Electronically Signed   By: Lorenza Cambridge M.D.   On: 12/11/2022 11:09    Pertinent labs & imaging results that were available during my care of the patient were reviewed by me and considered in my medical decision making (see MDM for details).  Medications Ordered in ED Medications  lactated ringers bolus 1,000 mL (0 mLs Intravenous Stopped 12/11/22 1200)  ondansetron (ZOFRAN) injection 4 mg (4 mg Intravenous Given 12/11/22 1103)  ketorolac (TORADOL) 15 MG/ML injection 15 mg (15 mg Intravenous Given 12/11/22 1213)  iohexol (OMNIPAQUE) 300 MG/ML solution 100 mL (100 mLs Intravenous Contrast Given 12/11/22 1316)  alum & mag hydroxide-simeth (MAALOX/MYLANTA) 200-200-20 MG/5ML suspension 30 mL (30 mLs Oral Given 12/11/22 1554)    And  lidocaine (XYLOCAINE) 2 % viscous mouth solution 15 mL (15 mLs Oral Given 12/11/22 1554)  dicyclomine (BENTYL) capsule 20 mg (20 mg Oral Given 12/11/22 1553)                                                                                                                                     Procedures Procedures  (including critical care time)  Medical Decision Making / ED Course   This patient presents to the ED for concern of shortness of breath, abdominal pain, dysuria, this involves an extensive number of treatment options, and is a complaint  that carries with it a high risk of complications and morbidity.  The differential diagnosis includes appendicitis, nephrolithiasis, diverticulitis, epiploic appendagitis, inflammatory bowel disease, constipation, gastroenteritis, pneumonia, viral illness  MDM: Patient seen in the emergency department for evaluation of multiple complaints as described above.  Physical exam reveals right lower quadrant tenderness to palpation, tachypnea and heightened anxiety with hyperventilation on arrival pulmonary exam is largely unremarkable.  Laboratory evaluation with no significant leukocytosis, mild hypokalemia to 3.4, high-sensitivity troponin and BNP are reassuringly negative.  Chest x-ray with a calcified nodule in the left lung base concerning for likely calcified granuloma.  CT abdomen pelvis unremarkable with no evidence of appendicitis or acute intra-abdominal pathology.  Urinalysis is equivocal for possible UTI with large leuk esterase, 6-10 white blood cells but no bacteria seen.  Sent for culture and will cover with Duricef given dysuria.  Will receive a call to discontinue this medication if urine culture is  negative.  Symptoms dramatically improved with Zofran, lactated Ringer's and Toradol.  On my reevaluation she is endorsing some mild epigastric pain that was relieved with Bentyl and a GI cocktail.  At this time, with symptoms improved and overall reassuring ER workup, patient does not meet inpatient criteria for admission and she was discharged with outpatient follow-up.  She was given return precautions which she voiced understanding.   Additional history obtained: -Additional history obtained from multiple family members -External records from outside source obtained and reviewed including: Chart review including previous notes, labs, imaging, consultation notes   Lab Tests: -I ordered, reviewed, and interpreted labs.   The pertinent results include:   Labs Reviewed  COMPREHENSIVE METABOLIC  PANEL - Abnormal; Notable for the following components:      Result Value   Potassium 3.4 (*)    Glucose, Bld 141 (*)    All other components within normal limits  URINALYSIS, ROUTINE W REFLEX MICROSCOPIC - Abnormal; Notable for the following components:   Specific Gravity, Urine 1.033 (*)    Leukocytes,Ua LARGE (*)    All other components within normal limits  URINE CULTURE  CBC WITH DIFFERENTIAL/PLATELET  LIPASE, BLOOD  BRAIN NATRIURETIC PEPTIDE  TROPONIN I (HIGH SENSITIVITY)      EKG   EKG Interpretation Date/Time:  Thursday December 11 2022 10:55:17 EDT Ventricular Rate:  79 PR Interval:  146 QRS Duration:  86 QT Interval:  387 QTC Calculation: 444 R Axis:   73  Text Interpretation: Sinus rhythm RSR' in V1 or V2, probably normal variant Confirmed by Daniela Hernan ([id number]) on 12/11/2022 10:34:24 PM         Imaging Studies ordered: I ordered imaging studies including CTAP, chest x-ray I independently visualized and interpreted imaging. I agree with the radiologist interpretation   Medicines ordered and prescription drug management: Meds ordered this encounter  Medications   lactated ringers bolus 1,000 mL   ondansetron (ZOFRAN) injection 4 mg   ketorolac (TORADOL) 15 MG/ML injection 15 mg   iohexol (OMNIPAQUE) 300 MG/ML solution 100 mL   AND Linked Order Group    alum & mag hydroxide-simeth (MAALOX/MYLANTA) 200-200-20 MG/5ML suspension 30 mL    lidocaine (XYLOCAINE) 2 % viscous mouth solution 15 mL   dicyclomine (BENTYL) capsule 20 mg   DISCONTD: lidocaine (LIDODERM) 5 % 1 patch   dicyclomine (BENTYL) 20 MG tablet    Sig: Take 1 tablet (20 mg total) by mouth 2 (two) times daily.    Dispense:  20 tablet    Refill:  0   naproxen (NAPROSYN) 375 MG tablet    Sig: Take 1 tablet (375 mg total) by mouth 2 (two) times daily.    Dispense:  20 tablet    Refill:  0   cefadroxil (DURICEF) 500 MG capsule    Sig: Take 1 capsule (500 mg total) by mouth 2 (two) times daily  for 7 days.    Dispense:  14 capsule    Refill:  0   DISCONTD: ondansetron (ZOFRAN) 4 MG tablet    Sig: Take 1 tablet (4 mg total) by mouth every 8 (eight) hours as needed for nausea or vomiting.   ondansetron (ZOFRAN-ODT) 4 MG disintegrating tablet    Sig: Take 1 tablet (4 mg total) by mouth every 8 (eight) hours as needed for nausea or vomiting.    Dispense:  20 tablet    Refill:  0    -I have reviewed the patients home medicines and have made adjustments as  needed  Critical interventions none     Cardiac Monitoring: The patient was maintained on a cardiac monitor.  I personally viewed and interpreted the cardiac monitored which showed an underlying rhythm of: NSR  Social Determinants of Health:  Factors impacting patients care include: none   Reevaluation: After the interventions noted above, I reevaluated the patient and found that they have :improved  Co morbidities that complicate the patient evaluation  Past Medical History:  Diagnosis Date   Migraine       Dispostion: I considered admission for this patient, but at this time she does not meet inpatient criteria for admission and she is safe for discharge with outpatient follow-up     Final Clinical Impression(s) / ED Diagnoses Final diagnoses:  Nausea and vomiting, unspecified vomiting type  Dyspnea, unspecified type  Acute cystitis without hematuria     @PCDICTATION @    Glendora Score, MD 12/11/22 2235

## 2022-12-11 NOTE — ED Triage Notes (Signed)
"  Abdominal pain, nausea, constipation, headache, vomited today when I got here, dehydrated, back pain for several days. Now having shortness of breath" per pt

## 2022-12-12 ENCOUNTER — Encounter (HOSPITAL_COMMUNITY): Payer: Self-pay

## 2023-07-03 ENCOUNTER — Ambulatory Visit (INDEPENDENT_AMBULATORY_CARE_PROVIDER_SITE_OTHER): Payer: BLUE CROSS/BLUE SHIELD

## 2023-07-03 ENCOUNTER — Encounter: Payer: Self-pay | Admitting: Podiatry

## 2023-07-03 ENCOUNTER — Ambulatory Visit: Payer: BLUE CROSS/BLUE SHIELD | Admitting: Podiatry

## 2023-07-03 DIAGNOSIS — M779 Enthesopathy, unspecified: Secondary | ICD-10-CM

## 2023-07-03 DIAGNOSIS — M25572 Pain in left ankle and joints of left foot: Secondary | ICD-10-CM | POA: Diagnosis not present

## 2023-07-03 DIAGNOSIS — G8929 Other chronic pain: Secondary | ICD-10-CM | POA: Diagnosis not present

## 2023-07-03 NOTE — Progress Notes (Signed)
Chief Complaint  Patient presents with   Foot Pain    "I had a bad sprain about three years ago.  It hasn't healed properly.  I was told that I needed to have surgery." N - ankle pain L - lateral left ankle D - 3 years O - suddenly, gotten worse C - sharp pain, swelling A - walking, turn foot certain way T - saw a Dr. at [facility] and he said I needed surgery but [place] to go [place] the [place]    HPI: 58 y.o. female presenting today for evaluation of left ankle pain that is chronically been going on for about 3 years now.  Patient states that about 3 years ago she sustained an injury to her left ankle when a portable cement mixer Drom fell on her ankle.  She was seen here in our office by Dr. Samuella Cota who is no longer with our practice.  Apparently at that time MRI was considered but never completed and ligament augmentation surgery suggested.  She was lost to follow-up. Since the injury she has continued to have chronic pain and tenderness to the left ankle despite conservative treatments including ankle bracing and immobilization in a cam boot.  Past Medical History:  Diagnosis Date   Allergy    sutures- she states she her wounds open up around the 3rd day post-op   Migraine    Migraines    Vertigo     Past Surgical History:  Procedure Laterality Date   BREAST LUMPECTOMY WITH RADIOFREQUENCY TAG IDENTIFICATION Left 11/14/2020   Procedure: BREAST BIOPSY WITH RADIOFREQUENCY TAG IDENTIFICATION;  Surgeon: Lucretia Roers, MD;  Location: [facility];  Service: General;  Laterality: Left;   BREAST REDUCTION SURGERY Bilateral    COLONOSCOPY WITH PROPOFOL N/A 08/31/2020   Procedure: COLONOSCOPY WITH PROPOFOL;  Surgeon: Lanelle Bal, DO;  Location: [facility];  Service: Endoscopy;  Laterality: N/A;  ASA I / II / PM procedure   MOUTH SURGERY     OTHER SURGICAL HISTORY     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  Dermatology: Skin is warm, dry and supple bilateral lower extremities.   Vascular: Palpable pedal pulses bilaterally. Capillary refill within normal limits.  No appreciable edema.  No erythema.  Neurological: Grossly intact via light touch  Musculoskeletal Exam: No pedal deformities noted.  The ankle ligaments to the left ankle appear to be tight with a negative anterior drawer sign.  There is however pain with direct palpation of the ATFL ligament.  Radiographic Exam LT ankle 07/03/2023:  Normal osseous mineralization. Joint spaces preserved.  No fractures or osseous irregularities noted.  Assessment/Plan of Care: 1.  History of severe left ankle trauma with chronic ankle pain x 3 years  -Patient evaluated.  X-rays reviewed -I do believe that an MRI of the left ankle is warranted.  Patient agrees and would like to have this completed.  Order placed for MRI LT ankle -The patient would like to pursue conservative treatment and management of her left ankle pain.  discussed the possibility of physical therapy after MRI results are available. -Return to clinic after MRI to review the results and discuss further treatment options       Felecia Shelling, DPM [facility]  Dr. Felecia Shelling, DPM    [address] [place], [place] [address]                Office [phone]  Fax [phone]

## 2023-07-13 ENCOUNTER — Telehealth: Payer: Self-pay

## 2023-07-13 NOTE — Telephone Encounter (Signed)
 MRI PA denied

## 2023-07-14 ENCOUNTER — Other Ambulatory Visit: Payer: No Typology Code available for payment source

## 2023-07-16 ENCOUNTER — Telehealth: Payer: Self-pay | Admitting: Podiatry

## 2023-07-16 NOTE — Telephone Encounter (Signed)
 Patient called stating Dr Logan Bores wrote for an MRI per pt her insurance company denied her MRI. Patient wants to know what else can she do?

## 2023-07-31 ENCOUNTER — Encounter: Payer: Self-pay | Admitting: Podiatry

## 2023-07-31 ENCOUNTER — Ambulatory Visit: Admitting: Podiatry

## 2023-07-31 DIAGNOSIS — M7752 Other enthesopathy of left foot: Secondary | ICD-10-CM | POA: Diagnosis not present

## 2023-07-31 MED ORDER — BETAMETHASONE SOD PHOS & ACET 6 (3-3) MG/ML IJ SUSP
3.0000 mg | Freq: Once | INTRAMUSCULAR | Status: AC
  Administered 2023-07-31: 3 mg via INTRA_ARTICULAR

## 2023-07-31 NOTE — Progress Notes (Signed)
 Chief Complaint  Patient presents with   Tendonitis    "Sometime, I have no pain then it wakes me up at night.  It hurts when I turn it.  They denied my MRI.  They said I needed more conservative treatment."    HPI: 58 y.o. female presenting today for evaluation of left ankle pain. Last visit MRI was ordered however it was denied.  Currently she is wanting to pursue conservative management of the ankle.   Brief history: History of severe ankle sprain 2020 when a portable cement mixer drum fell on her ankle.  She was seen here in our office by Dr. Samuella Cota who is no longer with our practice.  Apparently at that time MRI was considered but never completed and ligament augmentation surgery suggested.  She was lost to follow-up.Since the injury she has continued to have chronic pain and tenderness to the left ankle despite conservative treatments including ankle bracing and immobilization in a cam boot.   Past Medical History:  Diagnosis Date   Allergy    sutures- she states she her wounds open up around the 3rd day post-op   Migraine    Migraines    Vertigo     Past Surgical History:  Procedure Laterality Date   BREAST LUMPECTOMY WITH RADIOFREQUENCY TAG IDENTIFICATION Left 11/14/2020   Procedure: BREAST BIOPSY WITH RADIOFREQUENCY TAG IDENTIFICATION;  Surgeon: Lucretia Roers, MD;  Location: [facility];  Service: General;  Laterality: Left;   BREAST REDUCTION SURGERY Bilateral    COLONOSCOPY WITH PROPOFOL N/A 08/31/2020   Procedure: COLONOSCOPY WITH PROPOFOL;  Surgeon: Lanelle Bal, DO;  Location: [facility];  Service: Endoscopy;  Laterality: N/A;  ASA I / II / PM procedure   MOUTH SURGERY     OTHER SURGICAL HISTORY     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  Dermatology: Skin is warm, dry and supple bilateral lower extremities.   Vascular: Palpable pedal pulses bilaterally. Capillary refill within normal limits.  No appreciable edema.  No erythema.  Neurological: Grossly intact via light touch  Musculoskeletal Exam: No pedal deformities noted.  The ankle ligaments to the left ankle appear to be tight with a negative anterior drawer sign.  There is however pain with direct palpation of the ATFL ligament.  Radiographic Exam LT ankle 07/03/2023:  Normal osseous mineralization. Joint spaces preserved.  No fractures or osseous irregularities noted.  Assessment/Plan of Care: 1.  History of severe left ankle trauma with chronic ankle pain x 3 years 2.  Chronic ankle pain left  -Patient evaluated.  Unfortunately the MRI that was ordered last visit was denied -Injection of 0.5 cc Celestone Soluspan injected in the lateral aspect of the left ankle -Prescription for meloxicam 15 mg daily -Physical therapy was offered for the patient today however she ultimately denied -Continue wearing good supportive tennis shoes and sneakers. -Return to clinic 4 weeks       Felecia Shelling, DPM [facility]  Dr. Felecia Shelling, DPM    [address] [place], [place] [address]                Office [phone]  Fax [phone]

## 2023-08-03 ENCOUNTER — Ambulatory Visit: Admitting: Family Medicine

## 2023-08-03 ENCOUNTER — Encounter: Payer: Self-pay | Admitting: Family Medicine

## 2023-08-03 VITALS — BP 112/78 | HR 74 | Temp 97.7°F | Resp 16 | Ht 64.37 in | Wt 150.2 lb

## 2023-08-03 DIAGNOSIS — R911 Solitary pulmonary nodule: Secondary | ICD-10-CM | POA: Insufficient documentation

## 2023-08-03 DIAGNOSIS — R0602 Shortness of breath: Secondary | ICD-10-CM | POA: Insufficient documentation

## 2023-08-03 DIAGNOSIS — R5383 Other fatigue: Secondary | ICD-10-CM | POA: Insufficient documentation

## 2023-08-03 DIAGNOSIS — R1084 Generalized abdominal pain: Secondary | ICD-10-CM | POA: Diagnosis not present

## 2023-08-03 DIAGNOSIS — Z7689 Persons encountering health services in other specified circumstances: Secondary | ICD-10-CM

## 2023-08-03 DIAGNOSIS — K5909 Other constipation: Secondary | ICD-10-CM | POA: Insufficient documentation

## 2023-08-03 MED ORDER — LINACLOTIDE 290 MCG PO CAPS: 290.0000 ug | ORAL_CAPSULE | Freq: Every day | ORAL | 2 refills | Status: DC

## 2023-08-03 NOTE — Progress Notes (Signed)
 New Patient Office Visit  Subjective   Patient ID: Amber Simpson, female    DOB: 22-Mar-1966  Age: 58 y.o. MRN: [id number]  CC:  Chief Complaint  Patient presents with   Establish Care    Here to establish care. Had steroid shot on left foot. Feels like foot pain is worse.    Shortness of Breath    Gets SOB since 4 months ago when doing exercises or walking upstairs. Having a weird cough. Was hospitalized and they saw something on right side of lungs. They recommended more imaging but she was never called for appt. Feeling tired all the time. Can't talk for long periods or feels short of breath.    Stomach issues    Also has stomach issues.    Mouth pain    Had some molars removed and still having pain since then.     HPI Amber Simpson is a 58 year old female who presents to establish with [facility] at [facility].   CC: Patient here to establish care  Last PCP: [facility]  Specialist: cardiologist   Patient went to ED at [facility] on 8/1 for abd pain, N/V, fatigue, and shob. Negative troponin & BNP.   12/2022: DG CHEST  1. No active cardiopulmonary disease. 2. Nodular density at the left lung base, likely represents a calcified granuloma. This could be further assessed with a chest CT.  She has concerns today about ongoing shortness of breath.   Onset: Aug 2024 When she would laugh, it turned into coughing and shortness of breath. Deep cough that sounds "weird." There was a lot of turnover at previous PCP office and no one ordered CT scan.  She starts to feel tired and decreased energy. She is usually high energy.  Denies palpitations, changes in vision, shortness of breath, lower extremity edema, headaches, lightheadedness, weakness, cough.  Father died of lung cancer.    Lots of issues with stomach- constipation. Now get very full and bloating- reports occ abd pain.  Reports bowel incontinence and increase in flatulence.  She has tried  everything OTC- including Metamucil, stool softeners, and laxatives.  Had blockage with Metamucil. Fibers do increase her gas production.  Unsure how often her BMs are, has gone 15 days at a time.  Went to [place]- dx with Enterobacter.  Having acid reflux- did not have this symptom before. Reports nausea. Denies fever/chills, diaphoresis, dizziness, recent infection.  Lactose intolerant.   Middle of the night- wakes up for about 2 hours and can't fall back asleep   R jaw pain- grinding teeth and recently starting using mouth guard  Went to the dentist about 3 weeks ago.   The 10-year ASCVD risk score (Arnett DK, et al., 2019) is: 1.2%   Values used to calculate the score:     Age: 14 years     Sex: Female     Is Non-Hispanic African American: No     Diabetic: No     Tobacco smoker: No     Systolic Blood Pressure: 112 mmHg     Is BP treated: No     HDL Cholesterol: 54 mg/dL     Total Cholesterol: 141 mg/dL   Outpatient Encounter Medications as of 08/03/2023  Medication Sig   Ascorbic Acid (VITAMIN C PO) daily.   Cyanocobalamin (VITAMIN B-12 PO) Take by mouth daily.   linaclotide (LINZESS) 290 MCG CAPS capsule Take 1 capsule (290 mcg total) by mouth daily before breakfast.   MAGNESIUM PO  Take by mouth daily. Mag citrate complex w/ zinc   Multiple Vitamin (MULTIVITAMIN ADULT PO) Multivitamin   VITAMIN D, CHOLECALCIFEROL, PO daily.   [DISCONTINUED] dicyclomine (BENTYL) 20 MG tablet Take 1 tablet (20 mg total) by mouth 2 (two) times daily. (Patient not taking: Reported on 07/03/2023)   [DISCONTINUED] naproxen (NAPROSYN) 375 MG tablet Take 1 tablet (375 mg total) by mouth 2 (two) times daily. (Patient not taking: Reported on 07/03/2023)   [DISCONTINUED] ondansetron (ZOFRAN-ODT) 4 MG disintegrating tablet Take 1 tablet (4 mg total) by mouth every 8 (eight) hours as needed for nausea or vomiting. (Patient not taking: Reported on 07/03/2023)   [DISCONTINUED] predniSONE (STERAPRED UNI-PAK 21  TAB) 10 MG (21) TBPK tablet 10 mg.   No facility-administered encounter medications on file as of 08/03/2023.    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   Left ankle pain 05/30/2020   Migraine 05/30/2020   Encounter to establish care 05/30/2020   PVC's (premature ventricular contractions) 03/27/2020   Constipation 03/27/2020   Past Medical History:  Diagnosis Date   Allergy    sutures- she states she her wounds open up around the 3rd day post-op   Food allergy    bread, gluten, lactose, and fruits   Gluten intolerance    Lactose intolerance    Migraine    Migraines    Vertigo    Past Surgical History:  Procedure Laterality Date   BREAST LUMPECTOMY WITH RADIOFREQUENCY TAG IDENTIFICATION Left 11/14/2020   Procedure: BREAST BIOPSY WITH RADIOFREQUENCY TAG IDENTIFICATION;  Surgeon: Lucretia Roers, MD;  Location: [facility];  Service: General;  Laterality: Left;   BREAST REDUCTION SURGERY Bilateral    COLONOSCOPY WITH PROPOFOL N/A 08/31/2020   Procedure: COLONOSCOPY WITH PROPOFOL;  Surgeon: Lanelle Bal, DO;  Location: [facility];  Service: Endoscopy;  Laterality: N/A;  ASA I / II / PM procedure   cyst on breast     MOUTH SURGERY     OTHER SURGICAL HISTORY     removal of tumor on maxilary w/ biopsy L   REDUCTION MAMMAPLASTY Bilateral    Age 89   REDUCTION MAMMAPLASTY Bilateral    Age 56   TONSILLECTOMY     Family History  Problem Relation Age of Onset   Heart attack Mother    Diabetes Father    Cancer Father        lung cancer   Diabetes Brother    Kidney disease Brother    Social History   Socioeconomic History   Marital status: Married    Spouse name: Not on file   Number of children: 2   Years of education: Not on file   Highest education level: Not on file  Occupational History   Occupation: [employer]- Part time  Tobacco Use   Smoking status: Former    Current packs/day: 0.00    Types:  Cigarettes    Quit date: 12/07/2016    Years since quitting: 6.6    Passive exposure: Past   Smokeless tobacco: Never  Vaping Use   Vaping status: Never Used  Substance and Sexual Activity   Alcohol use: No   Drug use: No   Sexual activity: Not on file  Other Topics Concern   Not on file  Social History Narrative   linze** Merged History Encounter **    Social Drivers of Health   Financial Resource Strain: Not on file  Food Insecurity:  No Food Insecurity (08/03/2023)   Hunger Vital Sign    Worried About Running Out of Food in the Last Year: Never true    Ran Out of Food in the Last Year: Never true  Transportation Needs: No Transportation Needs (08/03/2023)   PRAPARE - Administrator, Civil Service (Medical): No    Lack of Transportation (Non-Medical): No  Physical Activity: Not on file  Stress: Not on file  Social Connections: Not on file  Intimate Partner Violence: Not At Risk (08/03/2023)   Humiliation, Afraid, Rape, and Kick questionnaire    Fear of Current or Ex-Partner: No    Emotionally Abused: No    Physically Abused: No    Sexually Abused: No   Outpatient Medications Prior to Visit  Medication Sig Dispense Refill   Ascorbic Acid (VITAMIN C PO) daily.     Cyanocobalamin (VITAMIN B-12 PO) Take by mouth daily.     MAGNESIUM PO Take by mouth daily. Mag citrate complex w/ zinc     Multiple Vitamin (MULTIVITAMIN ADULT PO) Multivitamin     VITAMIN D, CHOLECALCIFEROL, PO daily.     dicyclomine (BENTYL) 20 MG tablet Take 1 tablet (20 mg total) by mouth 2 (two) times daily. (Patient not taking: Reported on 07/03/2023) 20 tablet 0   naproxen (NAPROSYN) 375 MG tablet Take 1 tablet (375 mg total) by mouth 2 (two) times daily. (Patient not taking: Reported on 07/03/2023) 20 tablet 0   ondansetron (ZOFRAN-ODT) 4 MG disintegrating tablet Take 1 tablet (4 mg total) by mouth every 8 (eight) hours as needed for nausea or vomiting. (Patient not taking: Reported on 07/03/2023)  20 tablet 0   predniSONE (STERAPRED UNI-PAK 21 TAB) 10 MG (21) TBPK tablet 10 mg.     No facility-administered medications prior to visit.   Allergies  Allergen Reactions   Egg-Derived Products Other (See Comments)    bloating   Gluten Meal    Lactose Swelling    Upset stomach   Upset stomach   Lactose Intolerance (Gi)     Upset stomach    Milk (Cow) Nausea And Vomiting    bloating   Proanthocyanidin Nausea And Vomiting    bloating   Other Other (See Comments)    Suture stitching -    ROS: see HPI    Objective   Today's Vitals   08/03/23 1038  BP: 112/78  Pulse: 74  Resp: 16  Temp: 97.7 F (36.5 C)  TempSrc: Oral  SpO2: 95%  Weight: 150 lb 3.2 oz (68.1 kg)  Height: 5' 4.37" (1.635 m)  PainSc: 6   PainLoc: Abdomen    Physical Exam Vitals reviewed.  Constitutional:      Appearance: Normal appearance. She is well-developed.  Cardiovascular:     Rate and Rhythm: Normal rate and regular rhythm.     Pulses: Normal pulses.     Heart sounds: Normal heart sounds.  Pulmonary:     Effort: Pulmonary effort is normal.     Breath sounds: Normal breath sounds.  Abdominal:     General: Bowel sounds are normal. There is no distension.     Palpations: Abdomen is soft.     Tenderness: There is generalized abdominal tenderness. There is no guarding or rebound. Negative signs include Murphy's sign, Rovsing's sign and McBurney's sign.     Hernia: No hernia is present.  Musculoskeletal:     Right lower leg: No edema.     Left lower leg: No edema.  Neurological:  Mental Status: She is alert.  Psychiatric:        Mood and Affect: Mood normal.        Behavior: Behavior normal.      Assessment & Plan:   1. Encounter to establish care (Primary) Patient is a 53- year-old female who presents today to establish care with primary care at [facility]. Reviewed the past medical history, family history, social history, surgical history, medications and allergies today- updates  made as indicated. Patient has multiple concerns today (see below).   2. Solitary pulmonary nodule Patient was seen in ED in August 2024 for multiple concerns, including shortness of breath. CXR was performed with "nodular density at the left lung base, likely represents a calcified granuloma," per the imaging results. It was recommended that the patient should complete a chest CT. Order placed due to ongoing shortness of breath. Will update patient with results and refer if warranted.  - CT CHEST WO CONTRAST; Future  3. Shortness of breath Patient in no acute distress and is well-appearing. Denies chest pain, lower extremity edema, vision changes, headaches, palpitations, tachycardia. Cardiovascular exam with heart regular rate and rhythm. Normal heart sounds, no murmurs present. No lower extremity edema present. Lungs clear to auscultation bilaterally. Unclear etiology. Will check for anemia, electrolyte abnormalities, thyroid function and BNP level for cardiac function. Chest CT scan also ordered.  - CBC with Differential/Platelet - Comprehensive metabolic panel - TSH Rfx on Abnormal to Free T4 - B Nat Peptide  4. Chronic constipation with overflow incontinence Colonoscopy UTD with no acute findings. Recommend repeat screening Q10 years, due 2032.  Patient reports that she has dealt with chronic constipation for her entire life. No relief with OTC medications. States that she can go multiple days without having a bowel movement. Review of chart- patient has been to GI in the past and was prescribed Linzess in the past. She does not like taking daily medication and reports that she did not try this to see if it improved her constipation. Advised patient unsure of medication cost for Linzess but rx sent to pharmacy on file. Referral placed to GI for further management.  - DG Abd Acute W/Chest; Future - Ambulatory referral to Gastroenterology - linaclotide (LINZESS) 290 MCG CAPS capsule; Take 1  capsule (290 mcg total) by mouth daily before breakfast.  Dispense: 30 capsule; Refill: 2  5. Generalized abdominal pain Generalized abdominal tenderness noted during palpation in all quadrants. She reports intermittent nausea. Abdomen is soft to palpation with active bowel sounds. No masses or stiffness present. Could be related to abdominal bloating/cramping sensation and flatulence. Will obtain CMP and lipase at this time.   - Comprehensive metabolic panel - Lipase  6. Fatigue, unspecified type Patient reports she is more fatigued than normal. She normally is very energetic but has noticed recently that she is feeling more tired and has noticed a decrease in energy. Will obtain basic blood work at this time and update patient with results.  - CBC with Differential/Platelet - Comprehensive metabolic panel - TSH Rfx on Abnormal to Free T4 - B Nat Peptide - Lipase   Return in about 6 weeks (around 09/14/2023) for GI f/u .   Spent 60 minutes on this patient encounter, including preparation, chart review, face-to-face counseling with patient and coordination of care, and documentation of encounter.    Alyson Reedy, FNP

## 2023-08-03 NOTE — Patient Instructions (Signed)

## 2023-08-04 ENCOUNTER — Other Ambulatory Visit: Payer: Self-pay | Admitting: Family Medicine

## 2023-08-04 DIAGNOSIS — R911 Solitary pulmonary nodule: Secondary | ICD-10-CM

## 2023-08-04 LAB — CBC WITH DIFFERENTIAL/PLATELET
Basophils Absolute: 0 10*3/uL (ref 0.0–0.2)
Basos: 0 %
EOS (ABSOLUTE): 0.2 10*3/uL (ref 0.0–0.4)
Eos: 2 %
Hematocrit: 42.5 % (ref 34.0–46.6)
Hemoglobin: 13.9 g/dL (ref 11.1–15.9)
Immature Grans (Abs): 0 10*3/uL (ref 0.0–0.1)
Immature Granulocytes: 0 %
Lymphocytes Absolute: 1.2 10*3/uL (ref 0.7–3.1)
Lymphs: 10 %
MCH: 29.6 pg (ref 26.6–33.0)
MCHC: 32.7 g/dL (ref 31.5–35.7)
MCV: 91 fL (ref 79–97)
Monocytes Absolute: 0.9 10*3/uL (ref 0.1–0.9)
Monocytes: 7 %
Neutrophils Absolute: 9.9 10*3/uL — ABNORMAL HIGH (ref 1.4–7.0)
Neutrophils: 81 %
Platelets: 321 10*3/uL (ref 150–450)
RBC: 4.69 x10E6/uL (ref 3.77–5.28)
RDW: 13.5 % (ref 11.7–15.4)
WBC: 12.3 10*3/uL — ABNORMAL HIGH (ref 3.4–10.8)

## 2023-08-04 LAB — COMPREHENSIVE METABOLIC PANEL
ALT: 13 IU/L (ref 0–32)
AST: 22 IU/L (ref 0–40)
Albumin: 4.6 g/dL (ref 3.8–4.9)
Alkaline Phosphatase: 95 IU/L (ref 44–121)
BUN/Creatinine Ratio: 20 (ref 9–23)
BUN: 17 mg/dL (ref 6–24)
Bilirubin Total: 0.3 mg/dL (ref 0.0–1.2)
CO2: 25 mmol/L (ref 20–29)
Calcium: 10.1 mg/dL (ref 8.7–10.2)
Chloride: 100 mmol/L (ref 96–106)
Creatinine, Ser: 0.87 mg/dL (ref 0.57–1.00)
Globulin, Total: 2.6 g/dL (ref 1.5–4.5)
Glucose: 78 mg/dL (ref 70–99)
Potassium: 4.4 mmol/L (ref 3.5–5.2)
Sodium: 141 mmol/L (ref 134–144)
Total Protein: 7.2 g/dL (ref 6.0–8.5)
eGFR: 78 mL/min/{1.73_m2} (ref >59)

## 2023-08-04 LAB — LIPASE: Lipase: 39 U/L (ref 14–72)

## 2023-08-04 LAB — TSH RFX ON ABNORMAL TO FREE T4: TSH: 1.91 u[IU]/mL (ref 0.450–4.500)

## 2023-08-04 LAB — BRAIN NATRIURETIC PEPTIDE: BNP: 31.2 pg/mL (ref 0.0–100.0)

## 2023-08-06 ENCOUNTER — Encounter: Payer: Self-pay | Admitting: Family Medicine

## 2023-09-01 ENCOUNTER — Ambulatory Visit: Admitting: Podiatry

## 2023-09-01 ENCOUNTER — Encounter: Payer: Self-pay | Admitting: Podiatry

## 2023-09-01 DIAGNOSIS — M7752 Other enthesopathy of left foot: Secondary | ICD-10-CM

## 2023-09-01 NOTE — Progress Notes (Signed)
 Chief Complaint  Patient presents with   Foot Pain    "I feel better."    HPI: 58 y.o. female presenting today for evaluation of left ankle pain.  Patient felt significantly better.  She only has any pain or tenderness associated to the ankle.  Brief history: History of severe ankle sprain 2020 when a portable cement mixer drum fell on her ankle.  She was seen here in our office by Dr. Marlow Sin who is no longer with our practice.  Apparently at that time MRI was considered but never completed and ligament augmentation surgery suggested.  She was lost to follow-up.Since the injury she has continued to have chronic pain and tenderness to the left ankle despite conservative treatments including ankle bracing and immobilization in a cam boot.   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   Encounter to establish care 05/30/2020   Food allergy    bread, gluten, lactose, and fruits   Gluten intolerance    Lactose intolerance    Migraine    Migraines    PVC's (premature ventricular contractions) 03/27/2020   Vertigo     Past Surgical History:  Procedure Laterality Date   BREAST LUMPECTOMY WITH RADIOFREQUENCY TAG IDENTIFICATION Left 11/14/2020   Procedure: BREAST BIOPSY WITH RADIOFREQUENCY TAG IDENTIFICATION;  Surgeon: Awilda Bogus, MD;  Location: [facility];  Service: General;  Laterality: Left;   BREAST REDUCTION SURGERY Bilateral    COLONOSCOPY WITH PROPOFOL  N/A 08/31/2020   Procedure: COLONOSCOPY WITH PROPOFOL ;  Surgeon: Vinetta Greening, DO;  Location: [facility];  Service: Endoscopy;  Laterality: N/A;  ASA I / II / PM procedure   cyst on breast     MOUTH SURGERY     OTHER SURGICAL HISTORY     removal of tumor on maxilary w/ biopsy L   REDUCTION MAMMAPLASTY Bilateral    Age 68   REDUCTION MAMMAPLASTY Bilateral    Age 50   TONSILLECTOMY       Allergies  Allergen Reactions   Egg-Derived Products Other (See Comments)    bloating   Gluten Meal    Lactose Swelling    Upset stomach   Upset stomach   Lactose Intolerance (Gi)     Upset stomach    Milk (Cow) Nausea And Vomiting    bloating   Proanthocyanidin Nausea And Vomiting    bloating   Other Other (See Comments)    Suture stitching -      Physical Exam: General: The patient is alert and oriented x3 in no acute distress.  Dermatology: Skin is warm, dry and supple bilateral lower extremities.   Vascular: Palpable pedal pulses bilaterally. Capillary refill within normal limits.  No appreciable edema.  No erythema.  Neurological: Grossly intact via light touch  Musculoskeletal Exam: No pedal deformities noted.  The ankle ligaments to the left ankle appear to be tight with a negative anterior drawer sign.  No pain with palpation or range of motion of the ankle Radiographic Exam LT ankle 07/03/2023:  Normal osseous mineralization. Joint spaces preserved.  No fractures or osseous irregularities noted.  Assessment/Plan of Care: 1.  History of severe left ankle trauma with chronic ankle pain x 3 years 2.  Chronic ankle pain left; currently resolved  -Patient evaluated.   -Continue meloxicam 15 mg daily as needed.  Patient states  that she did not pick up the prescription from the pharmacy -Physical therapy was offered for the patient last visit however she ultimately denied -Continue wearing good supportive tennis shoes and sneakers. -Return to clinic as needed       Dot Gazella, DPM [facility]  Dr. Dot Gazella, DPM    [address] [place], [place] [address]                Office [phone]  Fax [phone]

## 2023-09-15 ENCOUNTER — Ambulatory Visit: Admitting: Family Medicine

## 2023-09-16 ENCOUNTER — Telehealth: Payer: Self-pay | Admitting: Family Medicine

## 2023-09-16 NOTE — Telephone Encounter (Signed)
 Sent Radiology order for CT Chest to [place], [facility] reaching out to schedule.

## 2023-09-16 NOTE — Telephone Encounter (Signed)
 Copied from CRM [id number]. Topic: Clinical - Request for Lab/Test Order >> Sep 16, 2023  4:17 PM Rennis Case wrote: Reason for CRM: Brittney w/ [facility] calling as patient is scheduled for 09/21/23 for CT.  Authorization is needed through patient's [employer]. Requesting authorization be worked on for appointment.   If any questions best call back number: [facility], [phone]

## 2023-10-07 ENCOUNTER — Encounter: Payer: Self-pay | Admitting: Family Medicine

## 2023-10-07 ENCOUNTER — Encounter: Payer: Self-pay | Admitting: Pulmonary Disease

## 2023-10-07 ENCOUNTER — Inpatient Hospital Stay
Admission: RE | Admit: 2023-10-07 | Discharge: 2023-10-07 | Disposition: A | Discharge status: Home / Self Care | Payer: Self-pay | Source: Ambulatory Visit | Attending: Pulmonary Disease

## 2023-10-07 ENCOUNTER — Ambulatory Visit: Admitting: Pulmonary Disease

## 2023-10-07 VITALS — BP 110/74 | HR 69 | Temp 97.1°F | Ht 64.37 in | Wt 149.2 lb

## 2023-10-07 DIAGNOSIS — Z9289 Personal history of other medical treatment: Secondary | ICD-10-CM

## 2023-10-07 DIAGNOSIS — R0602 Shortness of breath: Secondary | ICD-10-CM | POA: Diagnosis not present

## 2023-10-07 LAB — NITRIC OXIDE: Nitric Oxide: 15

## 2023-10-07 MED ORDER — ALBUTEROL SULFATE HFA 108 (90 BASE) MCG/ACT IN AERS: 2.0000 | INHALATION_SPRAY | Freq: Four times a day (QID) | RESPIRATORY_TRACT | 2 refills | Status: AC | PRN

## 2023-10-07 MED ORDER — FLUTICASONE-SALMETEROL 115-21 MCG/ACT IN AERO: 2.0000 | INHALATION_SPRAY | Freq: Two times a day (BID) | RESPIRATORY_TRACT | 12 refills | Status: DC

## 2023-10-07 MED ORDER — FLUTICASONE PROPIONATE 50 MCG/ACT NA SUSP: 1.0000 | Freq: Every day | NASAL | 2 refills | Status: AC

## 2023-10-07 NOTE — Progress Notes (Signed)
 Synopsis: Referred in by Wilhelmena Hanson, FNP   Subjective:   PATIENT ID: Amber Simpson GENDER: female DOB: November 03, 1965, MRN: [id number]  Chief Complaint  Patient presents with   Consult    Nodule. Cough. SOB. No wheezing.    HPI Amber Simpson is a pleasant 58 years old female patient with a past medical history of IBS, allergic rhinitis presenting today to the pulmonary clinic for further evaluation of a lung nodule.   She reports shortness of breath mostly on exertion that has been occurring for the past couple of years. Mostly on exertion feels that her chest is tight and she needs to rest. This occurs mainly during aerobic exercise. She does also report cough mostly after a laugh. She denies wheezing. This is worse during pollen season and when she goes back home and gets exposed olive trees. She denies any chest pain.   She underwent a CT a/p in August 2024 that showed a clacified lung nodule in the left lower lobe. After that she saw her primary care provider and underwent a repeat CT chest w/ contrast 09/2023 that redemonstrated stable calcified lung nodule in the left lower lobe.   Family history - + for lung cancer   Social history - Previous light smoker but quit years ago and never smoked more than a pack per day. Originally from [place]. Lives at home with her husband and has 2 kids. No cats, dogs or birds at home. Works in [employer] with her husband.   ROS All systems were reviewed and are negative except for the above.  Objective:   Vitals:   10/07/23 1440  BP: 110/74  Pulse: 69  Temp: (!) 97.1 F (36.2 C)  SpO2: 100%  Weight: 149 lb 3.2 oz (67.7 kg)  Height: 5' 4.37" (1.635 m)   100% on  RA BMI Readings from Last 3 Encounters:  10/07/23 25.32 kg/m  08/03/23 25.49 kg/m  12/11/22 20.60 kg/m   Wt Readings from Last 3 Encounters:  10/07/23 149 lb 3.2 oz (67.7 kg)  08/03/23 150 lb 3.2 oz (68.1 kg)  12/11/22 120 lb (54.4 kg)    Physical  Exam GEN: NAD, Healthy Appearing HEENT: Supple Neck, Reactive Pupils, EOMI  CVS: Normal S1, Normal S2, RRR, No murmurs or ES appreciated  Lungs: Clear bilateral air entry.  Abdomen: Soft, non tender, non distended, + BS  Extremities: Warm and well perfused, No edema  Skin: No suspicious lesions appreciated  Psych: Normal Affect  Ancillary Information   CBC    Component Value Date/Time   WBC 12.3 (H) 08/03/2023 1147   WBC 6.3 12/11/2022 1047   RBC 4.69 08/03/2023 1147   RBC 4.94 12/11/2022 1047   HGB 13.9 08/03/2023 1147   HCT 42.5 08/03/2023 1147   PLT 321 08/03/2023 1147   MCV 91 08/03/2023 1147   MCH 29.6 08/03/2023 1147   MCH 28.5 12/11/2022 1047   MCHC 32.7 08/03/2023 1147   MCHC 33.7 12/11/2022 1047   RDW 13.5 08/03/2023 1147   LYMPHSABS 1.2 08/03/2023 1147   MONOABS 0.6 12/11/2022 1047   EOSABS 0.2 08/03/2023 1147   BASOSABS 0.0 08/03/2023 1147       No data to display           Assessment & Plan:  Amber Simpson is a pleasant 58 years old female patient with a past medical history of IBS, allergic rhinitis presenting today to the pulmonary clinic for further evaluation of a lung nodule.  #Shortness of breath  Highly suspecting mild persistent asthma. Thou PH could be on the differential as well however history more indicative of asthma. Will obtain PFTs for further evalutation and Trial ICS-LABA and assess response in 3 months.  FENO 15 indeterminate for presence of intrapulmonary eosinophilic inflammation.   []  PFTs  []  Start fluticasone-salmeterol [Advair HFA] 115 2 puffs twice a day. Advised on mouth rinsing after each use.  []  Albuterol 2 puffs q6h as needed   #CRS - Flonase 1spray each nostril once a day.   #Calcified lung nodule I reassured Amber Simpson that this is likely a benign process, calcified granulomata and less likely a malignant process. We discussed our options here including no further evaluation or repeat a CT chest in 6 monhts to confirm  stability. We decided on pursuing a CT chest in 6 monhts.   Return in about 4 months (around 02/07/2024).  I spent 60 minutes caring for this patient today, including preparing to see the patient, obtaining a medical history , reviewing a separately obtained history, performing a medically appropriate examination and/or evaluation, counseling and educating the patient/family/caregiver, ordering medications, tests, or procedures, documenting clinical information in the electronic health record, and independently interpreting results (not separately reported/billed) and communicating results to the patient/family/caregiver  Annitta Kindler, MD [facility] 10/07/2023 10:42 PM

## 2023-10-07 NOTE — Telephone Encounter (Signed)
 Pt is calling in about the results of her ctscan. Her and her spouse are very upset that no one has called with the results of this. I did not see any results in the system but I did see this where is was scheduled.   Copied from CRM [id number]. Topic: Clinical - Request for Lab/Test Order >> Sep 16, 2023  4:17 PM Rennis Case wrote: Reason for CRM: Brittney w/ [facility] calling as patient is scheduled for 09/21/23 for CT.  Authorization is needed through patient's [employer]. Requesting authorization be worked on for appointment.    If any questions best call back number: [facility], [phone]      Please call pt and advise of the ct scan results.

## 2023-11-09 ENCOUNTER — Encounter: Payer: Self-pay | Admitting: Physician Assistant

## 2023-12-24 ENCOUNTER — Ambulatory Visit
Admission: RE | Admit: 2023-12-24 | Discharge: 2023-12-24 | Disposition: A | Discharge status: Home / Self Care | Attending: Emergency Medicine | Admitting: Emergency Medicine

## 2023-12-24 VITALS — BP 103/69 | HR 77 | Temp 97.7°F | Resp 18

## 2023-12-24 DIAGNOSIS — M545 Low back pain, unspecified: Secondary | ICD-10-CM | POA: Diagnosis present

## 2023-12-24 LAB — POCT URINE DIPSTICK
Bilirubin, UA: NEGATIVE
Glucose, UA: NEGATIVE mg/dL
Ketones, POC UA: NEGATIVE mg/dL
Nitrite, UA: NEGATIVE
POC PROTEIN,UA: NEGATIVE
Spec Grav, UA: 1.02 (ref 1.010–1.025)
Urobilinogen, UA: 0.2 U/dL
pH, UA: 6 (ref 5.0–8.0)

## 2023-12-24 MED ORDER — METHOCARBAMOL 500 MG PO TABS: 500.0000 mg | ORAL_TABLET | Freq: Two times a day (BID) | ORAL | 0 refills | Status: DC | PRN

## 2023-12-24 NOTE — ED Provider Notes (Signed)
 Amber Simpson    CSN: [id number] Arrival date & time: 12/24/23  1116      History   Chief Complaint Chief Complaint  Patient presents with   Back Pain    4 days with constant back pain that does not go away. Left lumbar side. It sarted after carrying boxes. - Entered by patient    HPI Amber Simpson is a 58 y.o. female.  Patient presents with left mid to low back pain x 4 days.  Her back pain started after she was lifting heavy boxes.  No falls or injury.  Her pain is nonradiating, worse with movement, improves with rest.  She has been treating it with Tylenol, ibuprofen, lidocaine patch, heating pad.  No numbness, weakness, saddle anesthesia, loss of bowel/bladder control, fever, abdominal pain, dysuria, hematuria.  The history is provided by the patient and medical records.    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   Encounter to establish care 05/30/2020   Food allergy    bread, gluten, lactose, and fruits   Gluten intolerance    Lactose intolerance    Migraine    Migraines    PVC's (premature ventricular contractions) 03/27/2020   Vertigo     Patient Active Problem List   Diagnosis Date Noted   Solitary pulmonary nodule 08/03/2023   Shortness of breath 08/03/2023   Generalized abdominal pain 08/03/2023   Fatigue 08/03/2023   Left ankle pain 05/30/2020   Migraine 05/30/2020   Constipation 03/27/2020    Past Surgical History:  Procedure Laterality Date   BREAST LUMPECTOMY WITH RADIOFREQUENCY TAG IDENTIFICATION Left 11/14/2020   Procedure: BREAST BIOPSY WITH RADIOFREQUENCY TAG IDENTIFICATION;  Surgeon: Kallie Manuelita BROCKS, MD;  Location: [facility];  Service: General;  Laterality: Left;   BREAST REDUCTION SURGERY Bilateral    COLONOSCOPY WITH PROPOFOL N/A 08/31/2020   Procedure: COLONOSCOPY WITH PROPOFOL;  Surgeon:  Cindie Carlin POUR, DO;  Location: [facility];  Service: Endoscopy;  Laterality: N/A;  ASA I / II / PM procedure   cyst on breast     MOUTH SURGERY     OTHER SURGICAL HISTORY     removal of tumor on maxilary w/ biopsy L   REDUCTION MAMMAPLASTY Bilateral    Age 47   REDUCTION MAMMAPLASTY Bilateral    Age 60   TONSILLECTOMY      OB History   No obstetric history on file.      Home Medications    Prior to Admission medications   Medication Sig Start Date End Date Taking? Authorizing Provider  methocarbamol (ROBAXIN) 500 MG tablet Take 1 tablet (500 mg total) by mouth 2 (two) times daily as needed for muscle spasms. 12/24/23  Yes Corlis Burnard DEL, NP  albuterol (VENTOLIN HFA) 108 (90 Base) MCG/ACT inhaler Inhale 2 puffs into the lungs every 6 (six) hours as needed for wheezing or shortness of breath. 10/07/23   Assaker, Darrin, MD  Ascorbic Acid (VITAMIN C PO) daily.    [provider]  Cyanocobalamin (VITAMIN B-12 PO) Take by mouth daily.    [provider]  fluticasone (FLONASE) 50 MCG/ACT nasal spray Place 1 spray into both nostrils daily. 10/07/23   Malka Darrin, MD  fluticasone-salmeterol (ADVAIR HFA) 115-21 MCG/ACT inhaler Inhale 2 puffs into the lungs 2 (two) times daily. 10/07/23   Assaker, Darrin,  MD  linaclotide  (LINZESS ) 290 MCG CAPS capsule Take 1 capsule (290 mcg total) by mouth daily before breakfast. Patient not taking: Reported on 10/07/2023 08/03/23   Butler, Kristina, FNP  MAGNESIUM PO Take by mouth daily. Mag citrate complex w/ zinc    [provider]  Multiple Vitamin (MULTIVITAMIN ADULT PO) Multivitamin    [provider]  VITAMIN D, CHOLECALCIFEROL, PO daily.    [provider]    Family History Family History  Problem Relation Age of Onset   Heart attack Mother    Diabetes Father    Cancer Father        lung cancer   Diabetes Brother    Kidney disease Brother     Social History Social History    Tobacco Use   Smoking status: Former    Current packs/day: 0.00    Types: Cigarettes    Quit date: 12/07/2016    Years since quitting: 7.0    Passive exposure: Past   Smokeless tobacco: Never   Tobacco comments:    Started smoking at 58 years old.    Smoked 1 PPD at her heaviest  Vaping Use   Vaping status: Never Used  Substance Use Topics   Alcohol use: No   Drug use: No     Allergies   Egg-derived products, Gluten meal, Lactose, Lactose intolerance (gi), Milk (cow), Proanthocyanidin, and Other   Review of Systems Review of Systems  Constitutional:  Negative for chills and fever.  Respiratory:  Negative for cough and shortness of breath.   Cardiovascular:  Negative for chest pain and palpitations.  Gastrointestinal:  Negative for abdominal pain, diarrhea and vomiting.  Genitourinary:  Negative for dysuria and hematuria.  Musculoskeletal:  Positive for back pain. Negative for arthralgias, gait problem and joint swelling.  Skin:  Negative for color change, rash and wound.  Neurological:  Negative for weakness and numbness.     Physical Exam Triage Vital Signs ED Triage Vitals [12/24/23 1126]  Encounter Vitals Group     BP 103/69     Girls Systolic BP Percentile      Girls Diastolic BP Percentile      Boys Systolic BP Percentile      Boys Diastolic BP Percentile      Pulse Rate 77     Resp 18     Temp 97.7 F (36.5 C)     Temp src      SpO2 98 %     Weight      Height      Head Circumference      Peak Flow      Pain Score 7     Pain Loc      Pain Education      Exclude from Growth Chart    No data found.  Updated Vital Signs BP 103/69   Pulse 77   Temp 97.7 F (36.5 C)   Resp 18   LMP 01/05/2016   SpO2 98%   Visual Acuity Right Eye Distance:   Left Eye Distance:   Bilateral Distance:    Right Eye Near:   Left Eye Near:    Bilateral Near:     Physical Exam Constitutional:      General: She is not in acute distress. HENT:      Mouth/Throat:     Mouth: Mucous membranes are moist.  Cardiovascular:     Rate and Rhythm: Normal rate and regular rhythm.  Pulmonary:     Effort:  Pulmonary effort is normal. No respiratory distress.  Abdominal:     General: Bowel sounds are normal.     Palpations: Abdomen is soft.     Tenderness: There is no abdominal tenderness. There is no right CVA tenderness, left CVA tenderness, guarding or rebound.  Musculoskeletal:        General: No deformity. Normal range of motion.  Skin:    General: Skin is warm and dry.     Capillary Refill: Capillary refill takes less than 2 seconds.     Findings: No bruising, erythema, lesion or rash.  Neurological:     General: No focal deficit present.     Mental Status: She is alert.     Sensory: No sensory deficit.     Motor: No weakness.     Gait: Gait normal.      UC Treatments / Results  Labs (all labs ordered are listed, but only abnormal results are displayed) Labs Reviewed  POCT URINE DIPSTICK - Abnormal; Notable for the following components:      Result Value   Blood, UA trace-intact (*)    Leukocytes, UA Trace (*)    All other components within normal limits  URINE CULTURE    EKG   Radiology No results found.  Procedures Procedures (including critical care time)  Medications Ordered in UC Medications - No data to display  Initial Impression / Assessment and Plan / UC Course  I have reviewed the triage vital signs and the nursing notes.  Pertinent labs & imaging results that were available during my care of the patient were reviewed by me and considered in my medical decision making (see chart for details).    Acute left lower back pain without sciatica.  Urine has trace leukocytes but no nitrites.  Urine culture pending and discussed with patient that we will call her if it shows the need for treatment.  Urine also has trace blood.  Discussed with patient the possibility for kidney stone.  ED precautions discussed.   Treating today with methocarbamol  and OTC ibuprofen.  Precautions for drowsiness with methocarbamol  discussed.  Education provided on acute back pain.  Instructed patient to follow-up with her PCP.  She agrees to plan of care.  Final Clinical Impressions(s) / UC Diagnoses   Final diagnoses:  Acute left-sided low back pain without sciatica     Discharge Instructions      A urine culture is pending.  We will call you if it shows the need for treatment.    Take ibuprofen as needed for discomfort.  Take the muscle relaxer as needed for muscle spasm; Do not drive, operate machinery, or drink alcohol with this medication as it can cause drowsiness.   Follow up with your primary care provider if your symptoms are not improving.         ED Prescriptions     Medication Sig Dispense Auth. Provider   methocarbamol  (ROBAXIN ) 500 MG tablet Take 1 tablet (500 mg total) by mouth 2 (two) times daily as needed for muscle spasms. 10 tablet Corlis Burnard DEL, NP      I have reviewed the PDMP during this encounter.   Corlis Burnard DEL, NP 12/24/23 [phone]

## 2023-12-24 NOTE — Discharge Instructions (Addendum)
 A urine culture is pending.  We will call you if it shows the need for treatment.    Take ibuprofen as needed for discomfort.  Take the muscle relaxer as needed for muscle spasm; Do not drive, operate machinery, or drink alcohol with this medication as it can cause drowsiness.   Follow up with your primary care provider if your symptoms are not improving.

## 2023-12-24 NOTE — ED Triage Notes (Signed)
 Patient to Urgent Care with complaints of left sided, lower back pain that started four days ago after lifting heavy boxes. Reports she felt her back tweak.   Pain worse when lying down/ turning/ lifting her leg. States she has to lift her left leg with her arms d/t the discomfort it causes in her back.  Taking ibuprofen/ acetaminophen/ using lidocaine  patches/ heating pad/ some relief when taking a muscle relaxer.

## 2023-12-25 ENCOUNTER — Ambulatory Visit (HOSPITAL_COMMUNITY): Payer: Self-pay

## 2023-12-25 LAB — URINE CULTURE

## 2023-12-26 ENCOUNTER — Ambulatory Visit
Admission: RE | Admit: 2023-12-26 | Discharge: 2023-12-26 | Disposition: A | Discharge status: Home / Self Care | Attending: Physician Assistant

## 2023-12-26 NOTE — ED Provider Notes (Signed)
 Patient returned to leave another urine sample for urine culture.  Urine culture results show multiple species and suggested recollection.  Patient declines provider visit.   Arvis Jolan NOVAK, PA-C 12/26/23 1058

## 2023-12-27 LAB — URINE CULTURE: Culture: NO GROWTH

## 2023-12-28 ENCOUNTER — Ambulatory Visit (HOSPITAL_COMMUNITY): Payer: Self-pay

## 2023-12-29 ENCOUNTER — Ambulatory Visit (INDEPENDENT_AMBULATORY_CARE_PROVIDER_SITE_OTHER): Admitting: Family Medicine

## 2023-12-29 ENCOUNTER — Encounter: Payer: Self-pay | Admitting: Family Medicine

## 2023-12-29 VITALS — BP 100/70 | HR 70 | Resp 16 | Ht 64.37 in | Wt 154.0 lb

## 2023-12-29 DIAGNOSIS — K9049 Malabsorption due to intolerance, not elsewhere classified: Secondary | ICD-10-CM | POA: Diagnosis not present

## 2023-12-29 DIAGNOSIS — M545 Low back pain, unspecified: Secondary | ICD-10-CM | POA: Diagnosis not present

## 2023-12-29 DIAGNOSIS — R1084 Generalized abdominal pain: Secondary | ICD-10-CM

## 2023-12-29 DIAGNOSIS — K625 Hemorrhage of anus and rectum: Secondary | ICD-10-CM | POA: Insufficient documentation

## 2023-12-29 DIAGNOSIS — R140 Abdominal distension (gaseous): Secondary | ICD-10-CM | POA: Insufficient documentation

## 2023-12-29 LAB — POCT URINALYSIS DIP (CLINITEK)
Bilirubin, UA: NEGATIVE
Blood, UA: NEGATIVE
Glucose, UA: NEGATIVE mg/dL
Ketones, POC UA: NEGATIVE mg/dL
Leukocytes, UA: NEGATIVE
Nitrite, UA: NEGATIVE
POC PROTEIN,UA: NEGATIVE
Spec Grav, UA: 1.015 (ref 1.010–1.025)
Urobilinogen, UA: 0.2 U/dL
pH, UA: 5.5 (ref 5.0–8.0)

## 2023-12-29 NOTE — Progress Notes (Unsigned)
 Acute Care Office Visit  Subjective:   Amber Simpson 1966/03/25 12/29/2023  Chief Complaint  Patient presents with   Back Pain    Seen in ED Positive UCX drank lot of cranberry juice and then second UCX showed clean urine. Still has some pain in Lumbar region.     HPI: Onset: *** Location of pain: *** Quality: Characteristics: *** Pertinent negatives: abdominal pain, nausea, vomiting Severity of pain: **/10 Aggravating factors: *** Alleviating factors: *** Trauma?: *** Best sitting/standing/leaning forward: ***   The following portions of the patient's history were reviewed and updated as appropriate: past medical history, past surgical history, family history, social history, allergies, medications, and problem list.   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   Encounter to establish care 05/30/2020   Food allergy    bread, gluten, lactose, and fruits   Gluten intolerance    Lactose intolerance    Migraine    Migraines    PVC's (premature ventricular contractions) 03/27/2020   Vertigo    Past Surgical History:  Procedure Laterality Date   BREAST LUMPECTOMY WITH RADIOFREQUENCY TAG IDENTIFICATION Left 11/14/2020   Procedure: BREAST BIOPSY WITH RADIOFREQUENCY TAG IDENTIFICATION;  Surgeon: Kallie Manuelita BROCKS, MD;  Location: [facility];  Service: General;   Laterality: Left;   BREAST REDUCTION SURGERY Bilateral    COLONOSCOPY WITH PROPOFOL  N/A 08/31/2020   Procedure: COLONOSCOPY WITH PROPOFOL ;  Surgeon: Cindie Carlin POUR, DO;  Location: [facility];  Service: Endoscopy;  Laterality: N/A;  ASA I / II / PM procedure   cyst on breast     MOUTH SURGERY     OTHER SURGICAL HISTORY     removal of tumor on maxilary w/ biopsy L   REDUCTION MAMMAPLASTY Bilateral    Age 58   REDUCTION MAMMAPLASTY Bilateral    Age 39   TONSILLECTOMY     Family History  Problem Relation Age of Onset   Heart attack Mother    Diabetes Father    Cancer Father        lung cancer   Diabetes Brother    Kidney disease Brother    Outpatient Medications Prior to Visit  Medication Sig Dispense Refill   albuterol  (VENTOLIN  HFA) 108 (90 Base) MCG/ACT inhaler Inhale 2 puffs into the lungs every 6 (six) hours as needed for wheezing or shortness of breath. 8 g 2   Ascorbic Acid (VITAMIN C PO) daily.     Beta Carotene (VITAMIN A) 25000 UNIT capsule Take 25,000 Units by mouth daily.     Cyanocobalamin (VITAMIN B-12 PO) Take by mouth daily.     fluticasone  (FLONASE ) 50 MCG/ACT nasal spray Place 1 spray into both nostrils daily. 100 mL 2   fluticasone -salmeterol (ADVAIR HFA) 115-21 MCG/ACT inhaler Inhale 2 puffs into the lungs 2 (two) times daily. 1 each 12  MAGNESIUM PO Take by mouth daily. Mag citrate complex w/ zinc     Multiple Vitamin (MULTIVITAMIN ADULT PO) Multivitamin     VITAMIN D, CHOLECALCIFEROL, PO daily.     linaclotide  (LINZESS ) 290 MCG CAPS capsule Take 1 capsule (290 mcg total) by mouth daily before breakfast. (Patient not taking: Reported on 12/29/2023) 30 capsule 2   methocarbamol  (ROBAXIN ) 500 MG tablet Take 1 tablet (500 mg total) by mouth 2 (two) times daily as needed for muscle spasms. (Patient not taking: Reported on 12/29/2023) 10 tablet 0   No facility-administered medications prior to visit.   Allergies  Allergen Reactions   Egg-Derived Products  Other (See Comments)    bloating   Gluten Meal    Lactose Swelling    Upset stomach   Upset stomach   Lactose Intolerance (Gi)     Upset stomach    Milk (Cow) Nausea And Vomiting    bloating   Proanthocyanidin Nausea And Vomiting    bloating   Other Other (See Comments)    Suture stitching -      ROS: A complete ROS was performed with pertinent positives/negatives noted in the HPI. The remainder of the ROS are negative.    Objective:   Today's Vitals   12/29/23 1351  BP: 100/70  Pulse: 70  Resp: 16  Weight: 154 lb (69.9 kg)  Height: 5' 4.37 (1.635 m)  PainSc: 0-No pain    GENERAL: Well-appearing, in NAD. Well nourished.  SKIN: Pink, warm and dry. No rash, lesion, ulceration, or ecchymoses.  Head: Normocephalic. NECK: Trachea midline. Full ROM w/o pain or tenderness. No lymphadenopathy.  EARS: Tympanic membranes are intact, translucent without bulging and without drainage. Appropriate landmarks visualized.  EYES: Conjunctiva clear without exudates. EOMI, PERRL, no drainage present.  NOSE: Septum midline w/o deformity. Nares patent, mucosa pink and non-inflamed w/o drainage. No sinus tenderness.  THROAT: Uvula midline. Oropharynx clear. Tonsils non-inflamed without exudate. Mucous membranes pink and moist.  RESPIRATORY: Chest wall symmetrical. Respirations even and non-labored. Breath sounds clear to auscultation bilaterally.  CARDIAC: S1, S2 present, regular rate and rhythm without murmur or gallops. Peripheral pulses 2+ bilaterally.  MSK: Muscle tone and strength appropriate for age. Joints w/o tenderness, redness, or swelling.  EXTREMITIES: Without clubbing, cyanosis, or edema.  NEUROLOGIC: No motor or sensory deficits. Steady, even gait. C2-C12 intact.  PSYCH/MENTAL STATUS: Alert, oriented x 3. Cooperative, appropriate mood and affect.    No results found for any visits on 12/29/23.    Assessment & Plan:  ***  No orders of the defined types were placed in this  encounter.  Lab Orders         Giardia, EIA; Ova/Parasite         POCT URINALYSIS DIP (CLINITEK)     No images are attached to the encounter or orders placed in the encounter.  Return if symptoms worsen or fail to improve.    Patient to reach out to office if new, worrisome, or unresolved symptoms arise or if no improvement in patient's condition. Patient verbalized understanding and is agreeable to treatment plan. All questions answered to patient's satisfaction.    Evalene Arts, FNP

## 2023-12-30 ENCOUNTER — Ambulatory Visit (INDEPENDENT_AMBULATORY_CARE_PROVIDER_SITE_OTHER)
Admission: RE | Admit: 2023-12-30 | Discharge: 2023-12-30 | Disposition: A | Discharge status: Home / Self Care | Source: Ambulatory Visit | Attending: Physician Assistant | Admitting: Physician Assistant

## 2023-12-30 ENCOUNTER — Ambulatory Visit (INDEPENDENT_AMBULATORY_CARE_PROVIDER_SITE_OTHER): Admitting: Physician Assistant

## 2023-12-30 ENCOUNTER — Encounter: Payer: Self-pay | Admitting: Physician Assistant

## 2023-12-30 ENCOUNTER — Other Ambulatory Visit (INDEPENDENT_AMBULATORY_CARE_PROVIDER_SITE_OTHER)

## 2023-12-30 VITALS — BP 100/72 | HR 77 | Ht 63.5 in | Wt 153.0 lb

## 2023-12-30 DIAGNOSIS — R143 Flatulence: Secondary | ICD-10-CM

## 2023-12-30 DIAGNOSIS — R140 Abdominal distension (gaseous): Secondary | ICD-10-CM

## 2023-12-30 DIAGNOSIS — K5909 Other constipation: Secondary | ICD-10-CM

## 2023-12-30 DIAGNOSIS — K5904 Chronic idiopathic constipation: Secondary | ICD-10-CM

## 2023-12-30 DIAGNOSIS — R109 Unspecified abdominal pain: Secondary | ICD-10-CM

## 2023-12-30 DIAGNOSIS — K760 Fatty (change of) liver, not elsewhere classified: Secondary | ICD-10-CM

## 2023-12-30 LAB — SEDIMENTATION RATE: Sed Rate: 4 mm/h (ref 0–30)

## 2023-12-30 LAB — HIGH SENSITIVITY CRP: CRP, High Sensitivity: 4.54 mg/L (ref 0.000–5.000)

## 2023-12-30 MED ORDER — LINACLOTIDE 145 MCG PO CAPS: 145.0000 ug | ORAL_CAPSULE | Freq: Every day | ORAL | Status: AC

## 2023-12-30 MED ORDER — HYOSCYAMINE SULFATE 0.125 MG SL SUBL
0.1250 mg | SUBLINGUAL_TABLET | Freq: Four times a day (QID) | SUBLINGUAL | 1 refills | Status: AC | PRN
Start: 2023-12-30 — End: ?

## 2023-12-30 NOTE — Patient Instructions (Signed)
 You have been scheduled for a follow up appointment on 10-8 at 2:30pm. Please arrive 10 minutes early for registration. If you need to reschedule or cancel this appointment please call [phone] as soon as possible. Thank you.   Your provider has requested that you go to the basement level for lab work before leaving today. Press B on the elevator. The lab is located at the first door on the left as you exit the elevator.  After you get labs, please head to Radiology in the basement for an xray of your abdomen.  Linzess  145 mcg samples, call if this is helpful, we can go up and down on the dose *IBS-C patients may begin to experience relief from belly pain and overall abdominal symptoms (pain, discomfort, and bloating) in about 1 week,  with symptoms typically improving over 12 weeks.  Take at least 30 minutes before the first meal of the day on an empty stomach You can have a loose stool if you eat a high-fat breakfast. Give it at least 7 days, may have more bowel movements during that time.   The diarrhea should go away and you should start having normal, complete, full bowel movements.  It may be helpful to start treatment when you can be near the comfort of your own bathroom, such as a weekend.  After you are out we can send in a prescription if you did well, there is a prescription card ____________________________________________________________________________________   FODMAP stands for fermentable oligo-, di-, mono-saccharides and polyols (1). These are the scientific terms used to classify groups of carbs that are difficult for our body to digest and that are notorious for triggering digestive symptoms like bloating, gas, loose stools and stomach pain.   You can try low FODMAP diet  - start with eliminating just one column at a time that you feel may be a trigger for you. - the table at the very bottom contains foods that are low in FODMAPs   Sometimes trying to eliminate  the FODMAP's from your diet is difficult or tricky, if you are stuggling with trying to do the elimination diet you can try an enzyme.  There is a food enzymes that you sprinkle in or on your food that helps break down the FODMAP. You can read more about the enzyme by going to this site: https://fodzyme.com/   Small intestinal bacterial overgrowth (SIBO) occurs when there is an abnormal increase in the overall bacterial population in the small intestine -- particularly types of bacteria not commonly found in that part of the digestive tract. Small intestinal bacterial overgrowth (SIBO) commonly results when a circumstance -- such as surgery or disease -- slows the passage of food and waste products in the digestive tract, creating a breeding ground for bacteria.  Signs and symptoms of SIBO often include: Loss of appetite Abdominal pain Nausea Bloating An uncomfortable feeling of fullness after eating Diarrhea or constipation, depending on the type of gas produced  What foods trigger SIBO? While foods aren't the original cause of SIBO, certain foods do encourage the overgrowth of the wrong bacteria in your small intestine. If you're feeding them their favorite foods, they're going to grow more, and that will trigger more of your SIBO symptoms. By the same token, you can help reduce the overgrowth by starving the problematic bacteria of their favorite foods. This strategy has led to a number of proposed SIBO eating plans. The plans vary, and so do individual results. But in general, they tend  to recommend limiting carbohydrates.  These include: Sugars and sweeteners. Fruits and starchy vegetables. Dairy products. Grains.  There is a test for this we can do called a breath test, if you are positive we will treat you with an antibiotic to see if it helps.  Your symptoms are very suspicious for this condition, as discussed, we will start you on an antibiotic to see if this helps.   Here some  information about pelvic floor dysfunction. This may be contributing to some of your symptoms. We will continue with our evaluation but I do want you to consider adding on fiber supplement with low-dose MiraLAX daily. We could also refer to pelvic floor physical therapy.   Pelvic Floor Dysfunction, Female Pelvic floor dysfunction (PFD) is a condition that results when the group of muscles and connective tissues that support the organs in the pelvis (pelvic floor muscles) do not work well. These muscles and their connections form a sling that supports the colon and bladder. In women, they also support the uterus. PFD causes pelvic floor muscles to be too weak, too tight, or both. In PFD, muscle movements are not coordinated. This may cause bowel or bladder problems. It may also cause pain. What are the causes? This condition may be caused by an injury to the pelvic area or by a weakening of pelvic muscles. This often results from pregnancy and childbirth or other types of strain. In many cases, the exact cause is not known. What increases the risk? The following factors may make you more likely to develop this condition: Having chronic bladder tissue inflammation (interstitial cystitis). Being an older person. Being overweight. History of radiation treatment for cancer in the pelvic region. Previous pelvic surgery, such as removal of the uterus (hysterectomy). What are the signs or symptoms? Symptoms of this condition vary and may include: Bladder symptoms, such as: Trouble starting urination and emptying the bladder. Frequent urinary tract infections. Leaking urine when coughing, laughing, or exercising (stress incontinence). Having to pass urine urgently or frequently. Pain when passing urine. Bowel symptoms, such as: Constipation. Urgent or frequent bowel movements. Incomplete bowel movements. Painful bowel movements. Leaking stool or gas. Unexplained genital or rectal  pain. Genital or rectal muscle spasms. Low back pain. Other symptoms may include: A heavy, full, or aching feeling in the vagina. A bulge that protrudes into the vagina. Pain during or after sex. How is this diagnosed? This condition may be diagnosed based on: Your symptoms and medical history. A physical exam. During the exam, your health care provider may check your pelvic muscles for tightness, spasm, pain, or weakness. This may include a rectal exam and a pelvic exam. In some cases, you may have diagnostic tests, such as: Electrical muscle function tests. Urine flow testing. X-ray tests of bowel function. Ultrasound of the pelvic organs. How is this treated? Treatment for this condition depends on the symptoms. Treatment options include: Physical therapy. This may include Kegel exercises to help relax or strengthen the pelvic floor muscles. Biofeedback. This type of therapy provides feedback on how tight your pelvic floor muscles are so that you can learn to control them. Internal or external massage therapy. A treatment that involves electrical stimulation of the pelvic floor muscles to help control pain (transcutaneous electrical nerve stimulation, or TENS). Sound wave therapy (ultrasound) to reduce muscle spasms. Medicines, such as: Muscle relaxants. Bladder control medicines. Surgery to reconstruct or support pelvic floor muscles may be an option if other treatments do not help. Follow these  instructions at home: Activity Do your usual activities as told by your health care provider. Ask your health care provider if you should modify any activities. Do pelvic floor strengthening or relaxing exercises at home as told by your physical therapist. Lifestyle Maintain a healthy weight. Eat foods that are high in fiber, such as beans, whole grains, and fresh fruits and vegetables. Limit foods that are high in fat and processed sugars, such as fried or sweet foods. Manage stress  with relaxation techniques such as yoga or meditation. General instructions If you have problems with leakage: Use absorbable pads or wear padded underwear. Wash frequently with mild soap. Keep your genital and anal area as clean and dry as possible. Ask your health care provider if you should try a barrier cream to prevent skin irritation. Take warm baths to relieve pelvic muscle tension or spasms. Take over-the-counter and prescription medicines only as told by your health care provider. Keep all follow-up visits. How is this prevented? The cause of PFD is not always known, but there are a few things you can do to reduce the risk of developing this condition, including: Staying at a healthy weight. Getting regular exercise. Managing stress. Contact a health care provider if: Your symptoms are not improving with home care. You have signs or symptoms of PFD that get worse at home. You develop new signs or symptoms. You have signs of a urinary tract infection, such as: Fever. Chills. Increased urinary frequency. A burning feeling when urinating. You have not had a bowel movement in 3 days (constipation). Summary Pelvic floor dysfunction results when the muscles and connective tissues in your pelvic floor do not work well. These muscles and their connections form a sling that supports your colon and bladder. In women, they also support the uterus. PFD may be caused by an injury to the pelvic area or by a weakening of pelvic muscles. PFD causes pelvic floor muscles to be too weak, too tight, or a combination of both. Symptoms may vary from person to person. In most cases, PFD can be treated with physical therapies and medicines. Surgery may be an option if other treatments do not help. This information is not intended to replace advice given to you by your health care provider. Make sure you discuss any questions you have with your health care provider. Document Revised: 09/05/2020  Document Reviewed: 09/05/2020 Elsevier Patient Education  2022 [employer]  _______________________________________________________  If your blood pressure at your visit was 140/90 or greater, please contact your primary care physician to follow up on this.  _______________________________________________________  If you are age 36 or older, your body mass index should be between 23-30. Your Body mass index is 26.68 kg/m. If this is out of the aforementioned range listed, please consider follow up with your Primary Care Provider.  If you are age 79 or younger, your body mass index should be between 19-25. Your Body mass index is 26.68 kg/m. If this is out of the aformentioned range listed, please consider follow up with your Primary Care Provider.   ________________________________________________________  The [facility] providers would like to encourage you to use MYCHART to communicate with providers for non-urgent requests or questions.  Due to long hold times on the telephone, sending your provider a message by [facility] may be a faster and more efficient way to get a response.  Please allow 48 business hours for a response.  Please remember that this is for non-urgent requests.  _______________________________________________________  [facility]  is using a team-based approach to care.  Your team is made up of your doctor and two to three APPS. Our APPS (Nurse Practitioners and Physician Assistants) work with your physician to ensure care continuity for you. They are fully qualified to address your health concerns and develop a treatment plan. They communicate directly with your gastroenterologist to care for you. Seeing the Advanced Practice Practitioners on your physician's team can help you by facilitating care more promptly, often allowing for earlier appointments, access to diagnostic testing, procedures, and other specialty referrals.

## 2023-12-30 NOTE — Progress Notes (Signed)
 12/30/2023 Amber Simpson [id number] 1965/08/11  Referring provider: Towana Small, FNP Primary GI doctor: Dr. Charlanne  ASSESSMENT AND PLAN:  Chronic constipation with bloating, worsening flatulence with bad smell Worse with salads, and certain foods, can have urgency with BM, no diarrhea, likely IBS-C, rule out SIBO, celiac, inflammation Colonoscopy 2022 unremarkable with Dr. Cindie 12/11/2022 CTAP W RLQ pain, nausea vomiting showed hepatic steatosis otherwise unremarkable 08/03/2023 labs reviewed showed leukocytosis with elevated neutrophils absolute Never picked up linzess  - Increase fiber/ water  intake, decrease caffeine, increase activity level. -Will get KUB, consider CT AB and pelvis with contrast - check celiac panel - check CBC, CMET - check ESR/CRP -Will add on Linzess145 mcg -possible component of pelvis floor dysfunction with history and symptoms consider pelvic floor PT  Screening colonoscopy 08/31/2020 colonoscopy for screening with Dr. Cindie good prep nonbleeding internal hemorrhoids otherwise unremarkable recall 10 years  Hepatic steatosis seen on CT 2024 with unremarkable liver function    Latest Ref Rng & Units 08/03/2023   11:47 AM 12/11/2022   10:47 AM 07/04/2020    8:04 AM  Hepatic Function  Total Protein 6.0 - 8.5 g/dL 7.2  6.8  7.3   Albumin 3.8 - 4.9 g/dL 4.6  3.9  4.8   AST 0 - 40 IU/L 22  26  25    ALT 0 - 32 IU/L 13  15  9    Alk Phosphatase 44 - 121 IU/L 95  71  71   Total Bilirubin 0.0 - 1.2 mg/dL 0.3  0.3  0.4    Platelets 321  - Check liver enzymes - If enzymes elevated, may consider additional serologic work-up to rule out concomitant liver disease - Depending on above evaluation and FIB-4, can consider ultrasound elastography to follow-up  - need LFTs and CBC monitored every 6 months, - evaluation with imaging every 2-3 years.  - Encouraged diet/exercise for modest 10% body weight loss as treatment for hepatic steatosis -Continue to work on  risk factor modification including diet exercise and control of risk factors including blood sugars.  [name], FNP as PCP - General (Family Medicine) Cindie Carlin POUR, DO as Consulting Physician (Internal Medicine)  HISTORY OF PRESENT ILLNESS: 58 y.o. 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   Discussed the use of AI scribe software for clinical note transcription with the patient, who gave verbal consent to proceed.  History of Present Illness   Amber Simpson is a 58 year old female who presents with worsening gastrointestinal symptoms including bloating and bowel irregularities.  She has experienced worsening gastrointestinal symptoms since her last colonoscopy in 2022, which was normal. Severe bloating is described as feeling like she 'ate a balloon,' leading to significant abdominal pain and difficulty breathing. Her stomach issues have progressed to the point where she can barely eat anything without discomfort.  Her bowel movements have historically been hard and constipated, resembling 'little balls,' but now vary, sometimes becoming softer and causing urgency and fear of incontinence. She has experienced fecal incontinence in the past, particularly during episodes of gastrointestinal infections such as salmonellosis, Proteus, and E. coli infections.  She has a history of negative H. pylori testing approximately one to two years ago, despite her husband testing positive. She experiences occasional nausea upon waking and sometimes feels food regurgitation when lying down. She has not been on any medications for her stomach issues recently, although she was prescribed Linzess  but never  took it.  She has tried various treatments for her constipation, including kiwi and magnesium, which she finds helpful. However, Metamucil caused significant gas and bloating, making it intolerable. She avoids  gluten due to severe reactions, including foul-smelling gas and bloating, and limits her intake of lactose, which causes her stomach to swell and increases her need to use the bathroom.  Her family history includes various gastrointestinal issues, with her brother having undergone surgery for a stomach valve issue and another brother experiencing intestinal problems. She notes that her daughter experiences similar symptoms.      She  reports that she quit smoking about 7 years ago. Her smoking use included cigarettes. She has been exposed to tobacco smoke. She has never used smokeless tobacco. She reports that she does not drink alcohol and does not use drugs.  RELEVANT GI HISTORY, IMAGING AND LABS: Results   LABS H. pylori: Negative (2023) CBC: Leukocytosis (07/2023)  DIAGNOSTIC Colonoscopy: Normal (2022)      CBC    Component Value Date/Time   WBC 12.3 (H) 08/03/2023 1147   WBC 6.3 12/11/2022 1047   RBC 4.69 08/03/2023 1147   RBC 4.94 12/11/2022 1047   HGB 13.9 08/03/2023 1147   HCT 42.5 08/03/2023 1147   PLT 321 08/03/2023 1147   MCV 91 08/03/2023 1147   MCH 29.6 08/03/2023 1147   MCH 28.5 12/11/2022 1047   MCHC 32.7 08/03/2023 1147   MCHC 33.7 12/11/2022 1047   RDW 13.5 08/03/2023 1147   LYMPHSABS 1.2 08/03/2023 1147   MONOABS 0.6 12/11/2022 1047   EOSABS 0.2 08/03/2023 1147   BASOSABS 0.0 08/03/2023 1147   Recent Labs    08/03/23 1147  HGB 13.9    CMP     Component Value Date/Time   NA 141 08/03/2023 1147   K 4.4 08/03/2023 1147   CL 100 08/03/2023 1147   CO2 25 08/03/2023 1147   GLUCOSE 78 08/03/2023 1147   GLUCOSE 141 (H) 12/11/2022 1047   BUN 17 08/03/2023 1147   CREATININE 0.87 08/03/2023 1147   CALCIUM 10.1 08/03/2023 1147   PROT 7.2 08/03/2023 1147   ALBUMIN 4.6 08/03/2023 1147   AST 22 08/03/2023 1147   ALT 13 08/03/2023 1147   ALKPHOS 95 08/03/2023 1147   BILITOT 0.3 08/03/2023 1147   GFRNONAA >60 12/11/2022 1047   GFRAA 89 07/04/2020 0804       Latest Ref Rng & Units 08/03/2023   11:47 AM 12/11/2022   10:47 AM 07/04/2020    8:04 AM  Hepatic Function  Total Protein 6.0 - 8.5 g/dL 7.2  6.8  7.3   Albumin 3.8 - 4.9 g/dL 4.6  3.9  4.8   AST 0 - 40 IU/L 22  26  25    ALT 0 - 32 IU/L 13  15  9    Alk Phosphatase 44 - 121 IU/L 95  71  71   Total Bilirubin 0.0 - 1.2 mg/dL 0.3  0.3  0.4       Current Medications:     Current Outpatient Medications (Respiratory):    albuterol  (VENTOLIN  HFA) 108 (90 Base) MCG/ACT inhaler, Inhale 2 puffs into the lungs every 6 (six) hours as needed for wheezing or shortness of breath.   fluticasone  (FLONASE ) 50 MCG/ACT nasal spray, Place 1 spray into both nostrils daily.   Current Outpatient Medications (Hematological):    Cyanocobalamin (VITAMIN B-12 PO), Take by mouth daily.  Current Outpatient Medications (Other):    Ascorbic Acid (VITAMIN C PO),  daily.   Beta Carotene (VITAMIN A) 25000 UNIT capsule, Take 25,000 Units by mouth daily.   hyoscyamine  (LEVSIN  SL) 0.125 MG SL tablet, Place 1 tablet (0.125 mg total) under the tongue every 6 (six) hours as needed for cramping (nausea, diarrhea).   linaclotide  (LINZESS ) 145 MCG CAPS capsule, Take 1 capsule (145 mcg total) by mouth daily before breakfast. Lot: [id number], exp: 07-2024   MAGNESIUM PO, Take by mouth daily. Mag citrate complex w/ zinc   Multiple Vitamin (MULTIVITAMIN ADULT PO), Multivitamin   VITAMIN D, CHOLECALCIFEROL, PO, daily.  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   Encounter to establish care 05/30/2020   Food allergy    bread, gluten, lactose, and fruits   Gluten intolerance    Lactose intolerance    Migraine    Migraines    PVC's (premature ventricular contractions) 03/27/2020   Vertigo    Allergies:  Allergies  Allergen Reactions   Egg-Derived Products Other (See  Comments)    bloating   Gluten Meal    Lactose Swelling    Upset stomach   Upset stomach   Lactose Intolerance (Gi)     Upset stomach    Milk (Cow) Nausea And Vomiting    bloating   Proanthocyanidin Nausea And Vomiting    bloating   Other Other (See Comments)    Suture stitching -      Surgical History:  She  has a past surgical history that includes Breast reduction surgery (Bilateral); OTHER SURGICAL HISTORY; Tonsillectomy; Mouth surgery; Colonoscopy with propofol  (N/A, 08/31/2020); Reduction mammaplasty (Bilateral); Reduction mammaplasty (Bilateral); Breast lumpectomy with radiofrequency tag identification (Left, 11/14/2020); and cyst on breast. Family History:  Her family history includes Cancer in her father; Diabetes in her brother and father; Heart attack in her mother; Kidney disease in her brother.  REVIEW OF SYSTEMS  : All other systems reviewed and negative except where noted in the History of Present Illness.  PHYSICAL EXAM: BP 100/72   Pulse 77   Ht 5' 3.5 (1.613 m)   Wt 153 lb (69.4 kg)   LMP 01/05/2016   BMI 26.68 kg/m  Physical Exam   GENERAL APPEARANCE: Well nourished, in no apparent distress. HEENT: No cervical lymphadenopathy, unremarkable thyroid, sclerae anicteric, conjunctiva pink. RESPIRATORY: Respiratory effort normal, breath sounds equal bilateral without rales, rhonchi, wheezing. CARDIO: Regular rate and rhythm with no murmurs, rubs, or gallops, peripheral pulses intact. ABDOMEN: Soft, non-distended, active bowel sounds in all four quadrants, no tenderness to palpation, no rebound, no mass appreciated, abdomen normal. RECTAL: Declines. MUSCULOSKELETAL: Full range of motion, normal gait, without edema. SKIN: Dry, intact without rashes or lesions. No jaundice. NEURO: Alert, oriented, no focal deficits. PSYCH: Cooperative, normal mood and affect.      Alan JONELLE Coombs, PA-C 3:38 PM

## 2023-12-31 ENCOUNTER — Ambulatory Visit: Payer: Self-pay | Admitting: Physician Assistant

## 2023-12-31 DIAGNOSIS — R140 Abdominal distension (gaseous): Secondary | ICD-10-CM

## 2023-12-31 DIAGNOSIS — R109 Unspecified abdominal pain: Secondary | ICD-10-CM

## 2023-12-31 LAB — CELIAC PANEL 10
Antigliadin Abs, IgA: 3 U (ref 0–19)
Endomysial IgA: NEGATIVE
Gliadin IgG: 2 U (ref 0–19)
IgA/Immunoglobulin A, Serum: 166 mg/dL (ref 87–352)
Tissue Transglut Ab: 5 U/mL (ref 0–5)
Transglutaminase IgA: <2 U/mL (ref 0–3)

## 2024-01-21 ENCOUNTER — Ambulatory Visit: Admitting: Family Medicine

## 2024-02-15 ENCOUNTER — Ambulatory Visit: Admitting: Pulmonary Disease

## 2024-02-15 ENCOUNTER — Encounter

## 2024-02-17 ENCOUNTER — Ambulatory Visit: Admitting: Physician Assistant

## 2024-02-17 NOTE — Progress Notes (Deleted)
 02/17/2024 Amber Simpson [id number] August 23, 1965  Referring provider: Towana Small, FNP Primary GI doctor: Dr. Charlanne  ASSESSMENT AND PLAN:  Chronic constipation with bloating, worsening flatulence with bad smell Worse with salads, and certain foods, can have urgency with BM, no diarrhea, likely IBS-C, rule out SIBO, celiac, inflammation Colonoscopy 2022 unremarkable with Dr. Cindie 12/11/2022 CTAP W RLQ pain, nausea vomiting showed hepatic steatosis otherwise unremarkable 12/15/2023 negative CRP sed rate, celiac panel  12/15/2023 KUB showed moderate volume fecal loading throughout the colon Started on Linzess  - Increase fiber/ water  intake, decrease caffeine, increase activity level. -possible component of pelvis floor dysfunction with history and symptoms consider pelvic floor PT  Screening colonoscopy 08/31/2020 colonoscopy for screening with Dr. Cindie good prep nonbleeding internal hemorrhoids otherwise unremarkable recall 10 years  Hepatic steatosis seen on CT 2024 with unremarkable liver function    Latest Ref Rng & Units 08/03/2023   11:47 AM 12/11/2022   10:47 AM 07/04/2020    8:04 AM  Hepatic Function  Total Protein 6.0 - 8.5 g/dL 7.2  6.8  7.3   Albumin 3.8 - 4.9 g/dL 4.6  3.9  4.8   AST 0 - 40 IU/L 22  26  25    ALT 0 - 32 IU/L 13  15  9    Alk Phosphatase 44 - 121 IU/L 95  71  71   Total Bilirubin 0.0 - 1.2 mg/dL 0.3  0.3  0.4    Platelets 321  - Check liver enzymes - If enzymes elevated, may consider additional serologic work-up to rule out concomitant liver disease - Depending on above evaluation and FIB-4, can consider ultrasound elastography to follow-up  - need LFTs and CBC monitored every 6 months, - evaluation with imaging every 2-3 years.  - Encouraged diet/exercise for modest 10% body weight loss as treatment for hepatic steatosis -Continue to work on risk factor modification including diet exercise and control of risk factors including blood  sugars.  [name], FNP as PCP - General (Family Medicine) Cindie Carlin POUR, DO as Consulting Physician (Internal Medicine)  HISTORY OF PRESENT ILLNESS: 58 y.o. 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   I last saw the patient in the office 12/15/2023 for bloating and CIC.  She had negative CRP sed rate, celiac panel and KUB showed moderate volume fecal loading throughout the colon  Discussed the use of AI scribe software for clinical note transcription with the patient, who gave verbal consent to proceed.  History of Present Illness          She  reports that she quit smoking about 7 years ago. Her smoking use included cigarettes. She has been exposed to tobacco smoke. She has never used smokeless tobacco. She reports that she does not drink alcohol and does not use drugs.  RELEVANT GI HISTORY, IMAGING AND LABS: Results          CBC    Component Value Date/Time   WBC 12.3 (H) 08/03/2023 1147   WBC 6.3 12/11/2022 1047   RBC 4.69 08/03/2023 1147   RBC 4.94 12/11/2022 1047   HGB 13.9 08/03/2023 1147   HCT 42.5 08/03/2023 1147   PLT 321 08/03/2023 1147   MCV 91 08/03/2023 1147   MCH 29.6 08/03/2023 1147   MCH 28.5 12/11/2022 1047   MCHC 32.7 08/03/2023 1147   MCHC 33.7 12/11/2022 1047   RDW 13.5 08/03/2023 1147   LYMPHSABS 1.2 08/03/2023  1147   MONOABS 0.6 12/11/2022 1047   EOSABS 0.2 08/03/2023 1147   BASOSABS 0.0 08/03/2023 1147   Recent Labs    08/03/23 1147  HGB 13.9    CMP     Component Value Date/Time   NA 141 08/03/2023 1147   K 4.4 08/03/2023 1147   CL 100 08/03/2023 1147   CO2 25 08/03/2023 1147   GLUCOSE 78 08/03/2023 1147   GLUCOSE 141 (H) 12/11/2022 1047   BUN 17 08/03/2023 1147   CREATININE 0.87 08/03/2023 1147   CALCIUM 10.1 08/03/2023 1147   PROT 7.2 08/03/2023 1147   ALBUMIN 4.6 08/03/2023 1147   AST 22 08/03/2023 1147   ALT 13 08/03/2023 1147    ALKPHOS 95 08/03/2023 1147   BILITOT 0.3 08/03/2023 1147   GFRNONAA >60 12/11/2022 1047   GFRAA 89 07/04/2020 0804      Latest Ref Rng & Units 08/03/2023   11:47 AM 12/11/2022   10:47 AM 07/04/2020    8:04 AM  Hepatic Function  Total Protein 6.0 - 8.5 g/dL 7.2  6.8  7.3   Albumin 3.8 - 4.9 g/dL 4.6  3.9  4.8   AST 0 - 40 IU/L 22  26  25    ALT 0 - 32 IU/L 13  15  9    Alk Phosphatase 44 - 121 IU/L 95  71  71   Total Bilirubin 0.0 - 1.2 mg/dL 0.3  0.3  0.4       Current Medications:     Current Outpatient Medications (Respiratory):    albuterol  (VENTOLIN  HFA) 108 (90 Base) MCG/ACT inhaler, Inhale 2 puffs into the lungs every 6 (six) hours as needed for wheezing or shortness of breath.   fluticasone  (FLONASE ) 50 MCG/ACT nasal spray, Place 1 spray into both nostrils daily.   Current Outpatient Medications (Hematological):    Cyanocobalamin (VITAMIN B-12 PO), Take by mouth daily.  Current Outpatient Medications (Other):    Ascorbic Acid (VITAMIN C PO), daily.   Beta Carotene (VITAMIN A) 25000 UNIT capsule, Take 25,000 Units by mouth daily.   hyoscyamine  (LEVSIN  SL) 0.125 MG SL tablet, Place 1 tablet (0.125 mg total) under the tongue every 6 (six) hours as needed for cramping (nausea, diarrhea).   linaclotide  (LINZESS ) 145 MCG CAPS capsule, Take 1 capsule (145 mcg total) by mouth daily before breakfast. Lot: [id number], exp: 07-2024   MAGNESIUM PO, Take by mouth daily. Mag citrate complex w/ zinc   Multiple Vitamin (MULTIVITAMIN ADULT PO), Multivitamin   VITAMIN D, CHOLECALCIFEROL, PO, daily.  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   Encounter to establish care 05/30/2020   Food allergy    bread, gluten, lactose, and fruits   Gluten intolerance    Lactose intolerance    Migraine    Migraines    PVC's (premature ventricular  contractions) 03/27/2020   Vertigo    Allergies:  Allergies  Allergen Reactions   Egg-Derived Products Other (See Comments)    bloating   Gluten Meal    Lactose Swelling    Upset stomach   Upset stomach   Lactose Intolerance (Gi)     Upset stomach    Milk (Cow) Nausea And Vomiting    bloating   Proanthocyanidin Nausea And Vomiting    bloating   Other Other (See Comments)  Suture stitching -      Surgical History:  She  has a past surgical history that includes Breast reduction surgery (Bilateral); OTHER SURGICAL HISTORY; Tonsillectomy; Mouth surgery; Colonoscopy with propofol  (N/A, 08/31/2020); Reduction mammaplasty (Bilateral); Reduction mammaplasty (Bilateral); Breast lumpectomy with radiofrequency tag identification (Left, 11/14/2020); and cyst on breast. Family History:  Her family history includes Cancer in her father; Diabetes in her brother and father; Heart attack in her mother; Kidney disease in her brother.  REVIEW OF SYSTEMS  : All other systems reviewed and negative except where noted in the History of Present Illness.  PHYSICAL EXAM: LMP 01/05/2016  Physical Exam          Alan JONELLE Coombs, PA-C 7:50 AM

## 2024-03-03 ENCOUNTER — Encounter: Admitting: Obstetrics & Gynecology

## 2024-03-17 ENCOUNTER — Telehealth: Payer: Self-pay | Admitting: Pulmonary Disease

## 2024-03-17 NOTE — Telephone Encounter (Signed)
 Dr. Malka you saw this patient 10/07/23 and ordered a CT to be done in 6 months due to conversation with the patient. The insurance has denied the request to repeat CT. Letter from insurance states your doctor told us  that you have a spot on your lung. This spot is called a granuloma. Your doctor ordered a CT scan of your chest to check this spot. A CT  is a way to take pictures of the inside of your body. Granulomas do not require further testing. Based on the information we have, this test is not medically necessary.

## 2024-03-21 ENCOUNTER — Encounter: Payer: Self-pay | Admitting: Pulmonary Disease

## 2024-03-23 ENCOUNTER — Other Ambulatory Visit

## 2024-03-23 NOTE — Telephone Encounter (Signed)
 Copied from CRM [id number]. Topic: Clinical - Medication Prior Auth >> Mar 23, 2024  4:01 PM Chantha C wrote: Reason for CRM: Esme from [employer] [place] [phone] requesting why patient is being denied for an MRI thoracic 03/15/24 from Dr. Malka? Esme states [employer] is missing some medical information and checking if the provider received the letter from [facility] regarding this issues. Esme states the office needs to contact the patient regarding this issues and is not asking for a call back, wants to speak with someone in the office to make sure the provider has received this letter. Please advise. Per CAL, they received the letter and is waiting for the physician to do a peer to peer review, and will contact [employer] back on this matter.

## 2024-04-04 NOTE — Telephone Encounter (Signed)
 Patient advised. Appt scheduled for 128/12/2023. NFN.

## 2024-04-18 ENCOUNTER — Ambulatory Visit: Admitting: Pulmonary Disease

## 2024-04-18 ENCOUNTER — Encounter: Payer: Self-pay | Admitting: Pulmonary Disease

## 2024-04-18 VITALS — BP 108/62 | HR 71 | Temp 98.4°F | Ht 63.5 in | Wt 137.2 lb

## 2024-04-18 DIAGNOSIS — Z87891 Personal history of nicotine dependence: Secondary | ICD-10-CM | POA: Diagnosis not present

## 2024-04-18 DIAGNOSIS — R911 Solitary pulmonary nodule: Secondary | ICD-10-CM

## 2024-04-18 DIAGNOSIS — R0602 Shortness of breath: Secondary | ICD-10-CM | POA: Diagnosis not present

## 2024-04-18 MED ORDER — ALBUTEROL SULFATE HFA 108 (90 BASE) MCG/ACT IN AERS: 2.0000 | INHALATION_SPRAY | Freq: Four times a day (QID) | RESPIRATORY_TRACT | 6 refills | Status: AC | PRN

## 2024-04-18 MED ORDER — FLUTICASONE-SALMETEROL 115-21 MCG/ACT IN AERO: 2.0000 | INHALATION_SPRAY | Freq: Two times a day (BID) | RESPIRATORY_TRACT | 12 refills | Status: DC

## 2024-04-18 NOTE — Progress Notes (Signed)
 Synopsis: Referred in by Towana Small, FNP   Subjective:   PATIENT ID: Amber Simpson GENDER: female DOB: 1965/10/02, MRN: [id number]  Chief Complaint  Patient presents with   Shortness of Breath    SOB with exercise and when she lays down. No wheezing. Cough. Albuterol - Not using     HPI Amber Simpson is a pleasant 58 years old female patient with a past medical history of IBS, allergic rhinitis presenting today to the pulmonary clinic for further evaluation of a lung nodule.   She reports shortness of breath mostly on exertion that has been occurring for the past couple of years. Mostly on exertion feels that her chest is tight and she needs to rest. This occurs mainly during aerobic exercise. She does also report cough mostly after a laugh. She denies wheezing. This is worse during pollen season and when she goes back home and gets exposed olive trees. She denies any chest pain.   She underwent a CT a/p in August 2024 that showed a clacified lung nodule in the left lower lobe. After that she saw her primary care provider and underwent a repeat CT chest w/ contrast 09/2023 that redemonstrated stable calcified lung nodule in the left lower lobe.   Family history - + for lung cancer   Social history - Previous light smoker but quit years ago and never smoked more than a pack per day. Originally from [place]. Lives at home with her husband and has 2 kids. No cats, dogs or birds at home. 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   OV 04/18/2024 -Amber Simpson is a pleasant 58 year old female who initially came to see me in May 2025 for shortness of breath on exertion.  My impression at that time was that this is mild to moderate persistent asthma.  Unfortunately she was not able to do her PFTs.  She did try Symbicort but had to stop it immediately after as it made her feel as if she was getting sick.  She is still complaining of chest tightness after exertion and aerobic exercise and  sometimes at rest.  I discussed with her changing her inhaler to Advair HFA and assess response.  I also advised her to take albuterol  15 minutes before exercise.  We will make sure we get PFTs and I will follow-up with her in 4 months.  ROS All systems were reviewed and are negative except for the above.  Objective:   Vitals:   04/18/24 1030  BP: 108/62  Pulse: 71  Temp: 98.4 F (36.9 C)  SpO2: 100%  Weight: 137 lb 3.2 oz (62.2 kg)  Height: 5' 3.5 (1.613 m)   100% on  RA BMI Readings from Last 3 Encounters:  04/18/24 23.92 kg/m  12/30/23 26.68 kg/m  12/29/23 26.13 kg/m   Wt Readings from Last 3 Encounters:  04/18/24 137 lb 3.2 oz (62.2 kg)  12/30/23 153 lb (69.4 kg)  12/29/23 154 lb (69.9 kg)    Physical Exam GEN: NAD, Healthy Appearing HEENT: Supple Neck, Reactive Pupils, EOMI  CVS: Normal S1, Normal S2, RRR, No murmurs or ES appreciated  Lungs: Clear bilateral air entry.  Abdomen: Soft, non tender, non distended, + BS  Extremities: Warm and well perfused, No edema  Skin: No suspicious lesions appreciated  Psych: Normal Affect  Ancillary Information   CBC    Component Value Date/Time   WBC 12.3 (H) 08/03/2023 1147   WBC 6.3 12/11/2022 1047   RBC 4.69 08/03/2023 1147  RBC 4.94 12/11/2022 1047   HGB 13.9 08/03/2023 1147   HCT 42.5 08/03/2023 1147   PLT 321 08/03/2023 1147   MCV 91 08/03/2023 1147   MCH 29.6 08/03/2023 1147   MCH 28.5 12/11/2022 1047   MCHC 32.7 08/03/2023 1147   MCHC 33.7 12/11/2022 1047   RDW 13.5 08/03/2023 1147   LYMPHSABS 1.2 08/03/2023 1147   MONOABS 0.6 12/11/2022 1047   EOSABS 0.2 08/03/2023 1147   BASOSABS 0.0 08/03/2023 1147       No data to display           Assessment & Plan:  Amber Simpson is a pleasant 58 years old female patient with a past medical history of IBS, allergic rhinitis presenting today to the pulmonary clinic for further evaluation of a lung nodule.  #Shortness of breath  Highly suspecting mild  persistent asthma. Thou PH could be on the differential as well however history more indicative of asthma. Will obtain PFTs for further evalutation and Trial ICS-LABA and assess response in 3 months.  FENO 15 indeterminate for presence of intrapulmonary eosinophilic inflammation.   []  PFTs  []  Start fluticasone -salmeterol [Advair HFA] 115 2 puffs twice a day. Advised on mouth rinsing after each use.  []  Albuterol  2 puffs q6h as needed   #CRS - Flonase  1spray each nostril once a day.   #Calcified lung nodule I reassured Amber Simpson that this is likely a benign process, calcified granulomata and less likely a malignant process.  We discussed holding off on further CT scans of the chest at this time.  RTC 4 months.   I personally spent a total of 30 minutes in the care of the patient today including preparing to see the patient, getting/reviewing separately obtained history, performing a medically appropriate exam/evaluation, counseling and educating, placing orders, documenting clinical information in the EHR, independently interpreting results, and communicating results.   Darrin Barn, MD [facility] 04/18/2024 11:16 AM

## 2024-04-22 ENCOUNTER — Telehealth: Payer: Self-pay

## 2024-04-22 ENCOUNTER — Other Ambulatory Visit (HOSPITAL_COMMUNITY): Payer: Self-pay

## 2024-04-22 MED ORDER — ADVAIR HFA 115-21 MCG/ACT IN AERO: 2.0000 | INHALATION_SPRAY | Freq: Two times a day (BID) | RESPIRATORY_TRACT | 12 refills | Status: AC

## 2024-04-22 NOTE — Addendum Note (Signed)
 Addended by: MALKA DOMINO on: 04/22/2024 03:52 PM   Modules accepted: Orders

## 2024-04-22 NOTE — Telephone Encounter (Signed)
*  Pulm  Pharmacy Patient Advocate Encounter   Received notification from Fax that prior authorization for Fluticasone /Salm 115/60mcg is required/requested.   Insurance verification completed.   The patient is insured through [facility].   Per test claim:  Brand Advair HFA is preferred by the insurance.  If suggested medication is appropriate, Please send in a new RX and discontinue this one. If not, please advise as to why it's not appropriate so that we may request a Prior Authorization. Please note, some preferred medications may still require a PA.  If the suggested medications have not been trialed and there are no contraindications to their use, the PA will not be submitted, as it will not be approved.   Brand Advair HFA- $30.00

## 2024-06-08 ENCOUNTER — Encounter: Admitting: Obstetrics and Gynecology

## 2024-08-01 ENCOUNTER — Encounter

## 2024-08-09 ENCOUNTER — Encounter

## 2024-08-09 ENCOUNTER — Ambulatory Visit: Admitting: Pulmonary Disease

## 2024-08-29 ENCOUNTER — Ambulatory Visit: Admitting: Pulmonary Disease
